# Patient Record
Sex: Male | Born: 1955
Health system: Southern US, Community
[De-identification: ages and names within clinical notes are randomized; demographics above are authoritative.]

## PROBLEM LIST (undated history)

## (undated) DIAGNOSIS — M1990 Unspecified osteoarthritis, unspecified site: Secondary | ICD-10-CM

## (undated) DIAGNOSIS — E785 Hyperlipidemia, unspecified: Secondary | ICD-10-CM

## (undated) DIAGNOSIS — K219 Gastro-esophageal reflux disease without esophagitis: Secondary | ICD-10-CM

## (undated) DIAGNOSIS — I10 Essential (primary) hypertension: Secondary | ICD-10-CM

## (undated) DIAGNOSIS — Z9109 Other allergy status, other than to drugs and biological substances: Secondary | ICD-10-CM

## (undated) DIAGNOSIS — T7840XA Allergy, unspecified, initial encounter: Secondary | ICD-10-CM

## (undated) DIAGNOSIS — K589 Irritable bowel syndrome without diarrhea: Secondary | ICD-10-CM

## (undated) HISTORY — DX: Hyperlipidemia, unspecified: E78.5

## (undated) HISTORY — DX: Allergy, unspecified, initial encounter: T78.40XA

## (undated) HISTORY — PX: UPPER GASTROINTESTINAL ENDOSCOPY: SHX188

## (undated) HISTORY — DX: Essential (primary) hypertension: I10

## (undated) HISTORY — DX: Unspecified osteoarthritis, unspecified site: M19.90

## (undated) HISTORY — PX: COLONOSCOPY: SHX174

---

## 1999-12-29 HISTORY — PX: COLECTOMY: SHX59

## 2003-05-30 ENCOUNTER — Ambulatory Visit (HOSPITAL_COMMUNITY): Admission: RE | Admit: 2003-05-30 | Discharge: 2003-05-30 | Payer: Self-pay | Admitting: Internal Medicine

## 2003-05-30 ENCOUNTER — Encounter: Payer: Self-pay | Admitting: Internal Medicine

## 2003-06-06 ENCOUNTER — Ambulatory Visit (HOSPITAL_COMMUNITY): Admission: RE | Admit: 2003-06-06 | Discharge: 2003-06-06 | Payer: Self-pay | Admitting: General Surgery

## 2006-07-27 ENCOUNTER — Ambulatory Visit (HOSPITAL_COMMUNITY): Admission: RE | Admit: 2006-07-27 | Discharge: 2006-07-27 | Payer: Self-pay | Admitting: Family Medicine

## 2006-07-27 ENCOUNTER — Encounter: Payer: Self-pay | Admitting: Vascular Surgery

## 2007-03-20 ENCOUNTER — Emergency Department (HOSPITAL_COMMUNITY): Admission: EM | Admit: 2007-03-20 | Discharge: 2007-03-20 | Payer: Self-pay | Admitting: Emergency Medicine

## 2007-09-07 ENCOUNTER — Ambulatory Visit: Payer: Self-pay | Admitting: Gastroenterology

## 2007-09-21 ENCOUNTER — Ambulatory Visit: Payer: Self-pay | Admitting: Gastroenterology

## 2007-09-21 ENCOUNTER — Encounter: Payer: Self-pay | Admitting: Gastroenterology

## 2009-01-13 ENCOUNTER — Emergency Department (HOSPITAL_COMMUNITY): Admission: EM | Admit: 2009-01-13 | Discharge: 2009-01-13 | Payer: Self-pay | Admitting: Emergency Medicine

## 2009-01-21 ENCOUNTER — Emergency Department (HOSPITAL_COMMUNITY): Admission: EM | Admit: 2009-01-21 | Discharge: 2009-01-21 | Payer: Self-pay | Admitting: Emergency Medicine

## 2009-09-12 ENCOUNTER — Emergency Department (HOSPITAL_COMMUNITY): Admission: EM | Admit: 2009-09-12 | Discharge: 2009-09-12 | Payer: Self-pay | Admitting: Emergency Medicine

## 2009-09-12 IMAGING — CT CT PELVIS W/O CM
2 of 4 series · 16 of 46 positions shown, 18 images · non-contrast
Comparison: None

CT ABDOMEN

CLINICAL DATA: Left flank pain.  History of stones.

CT OF THE ABDOMEN AND PELVIS WITHOUT CONTRAST (CT UROGRAM)
TECHNIQUE: Multidetector CT imaging was performed through the
abdomen and pelvis to include the urinary tract.

[Series 2: standard/full over (age)lbs 5.0 · axial · 0.75mm/px · z∈[-477,-37]mm · 13 of 98 slices shown, 15 images]
[im 5/98  soft-tissue]
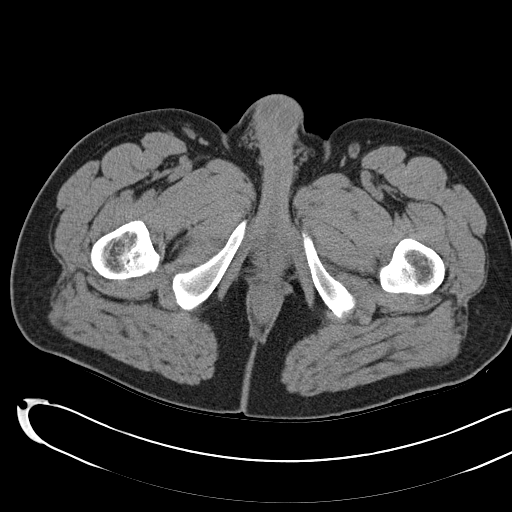
[im 5/98  bone]
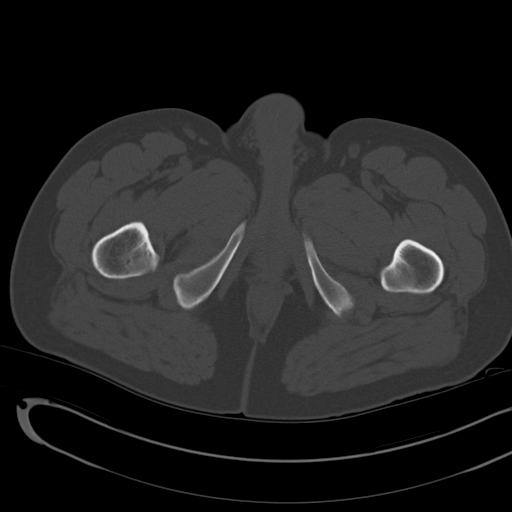
[im 13/98  soft-tissue]
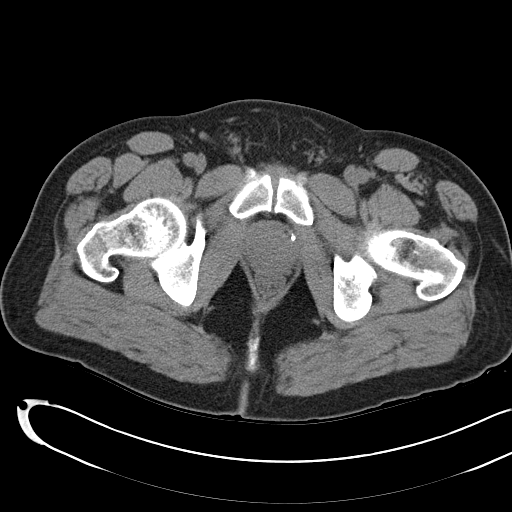
[im 21/98  soft-tissue]
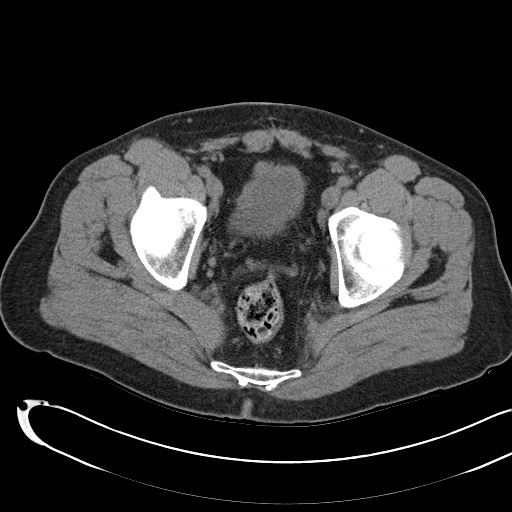
[im 29/98  soft-tissue]
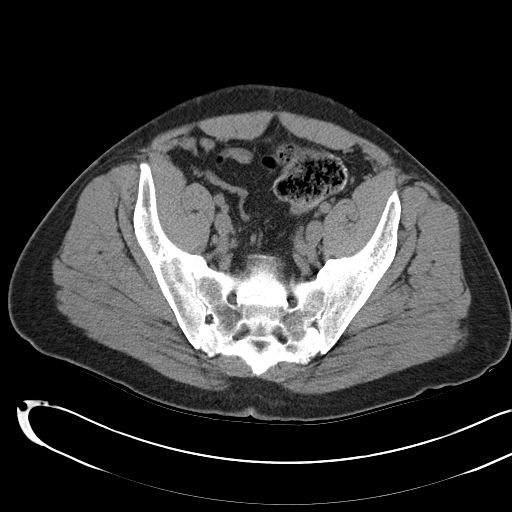
[im 33/98  soft-tissue]
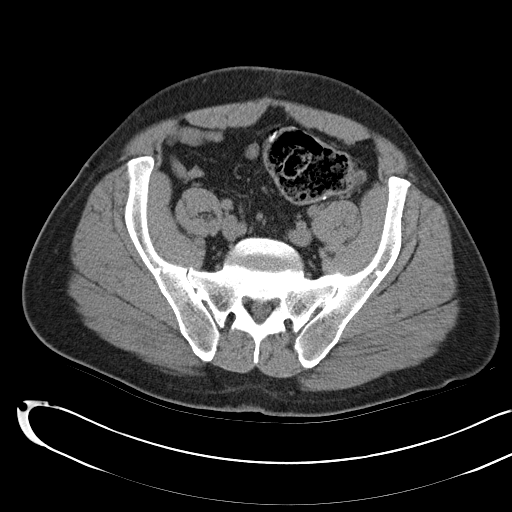
[im 41/98  soft-tissue]
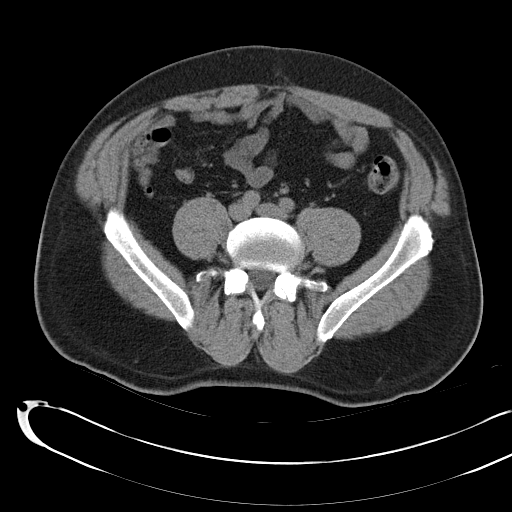
[im 49/98  soft-tissue]
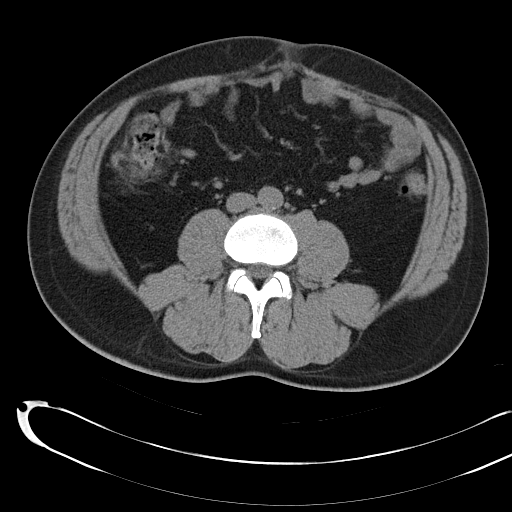
[im 57/98  soft-tissue]
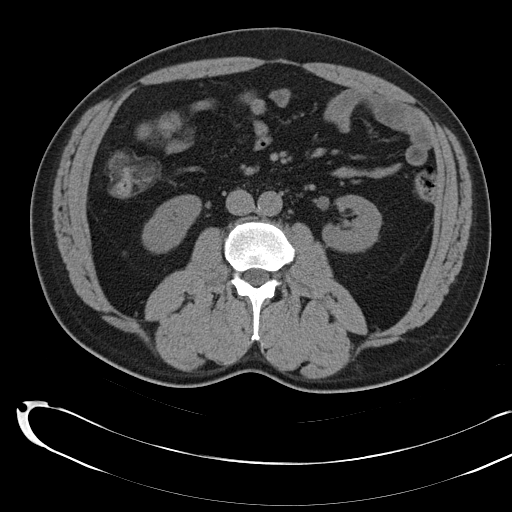
[im 65/98  soft-tissue]
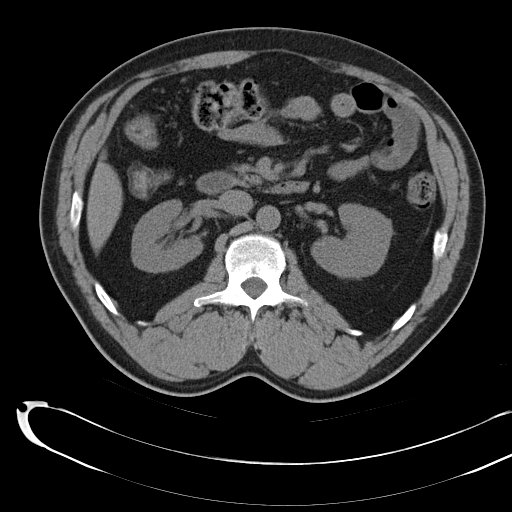
[im 65/98  bone]
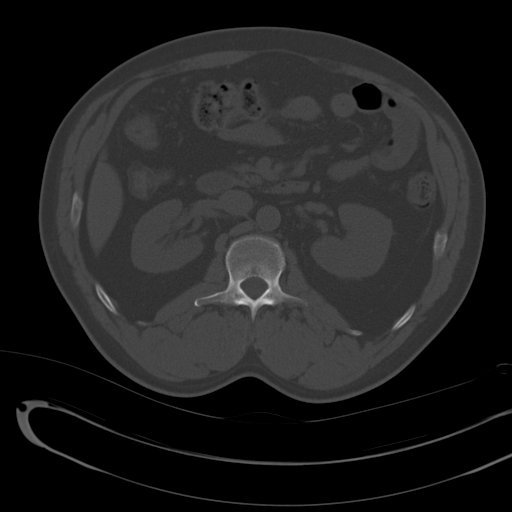
[im 69/98  soft-tissue]
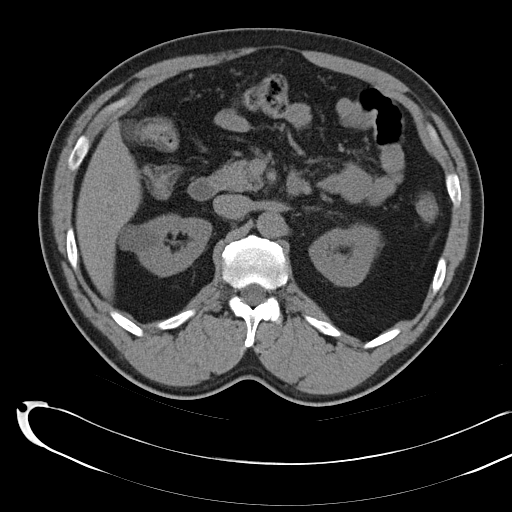
[im 77/98  soft-tissue]
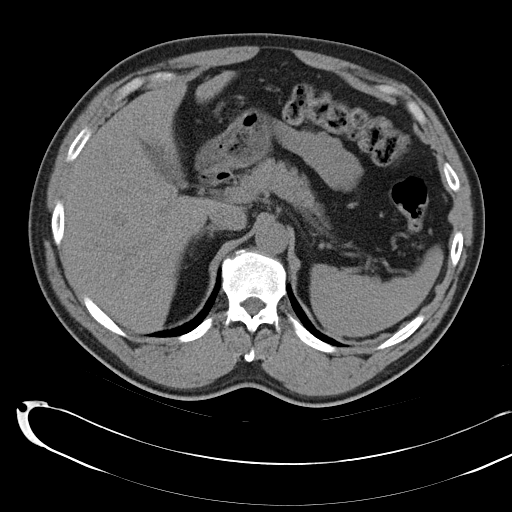
[im 85/98  soft-tissue]
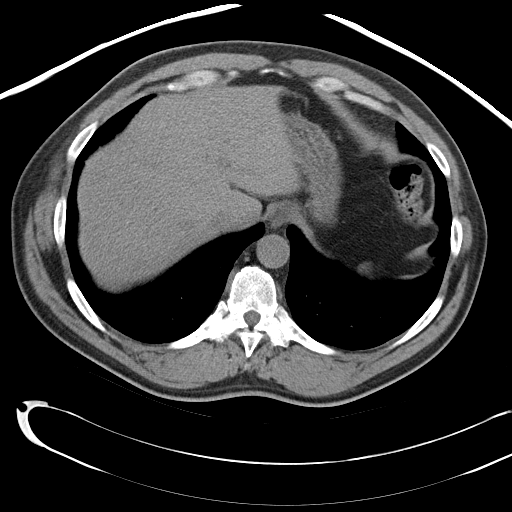
[im 93/98  soft-tissue]
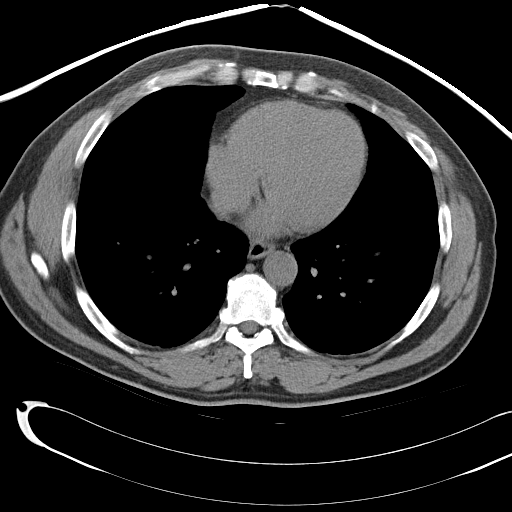

[Series 4: mpr coronal · coronal · 0.67mm/px · 3 of 82 slices shown]
[im 28/82  soft-tissue]
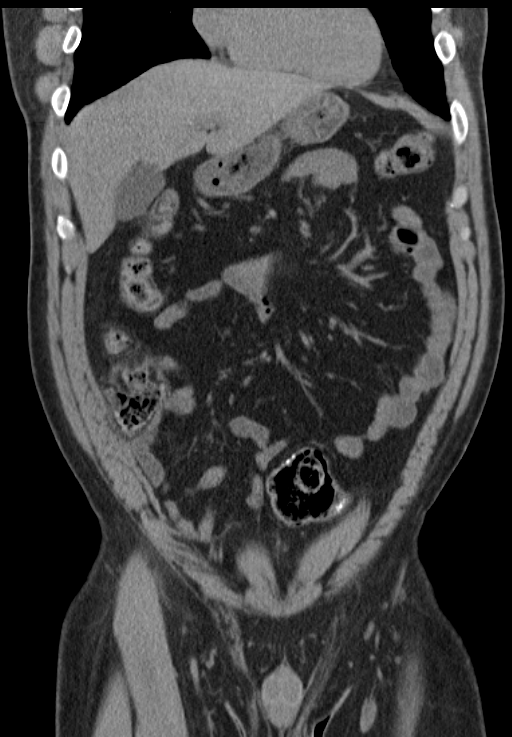
[im 37/82  soft-tissue]
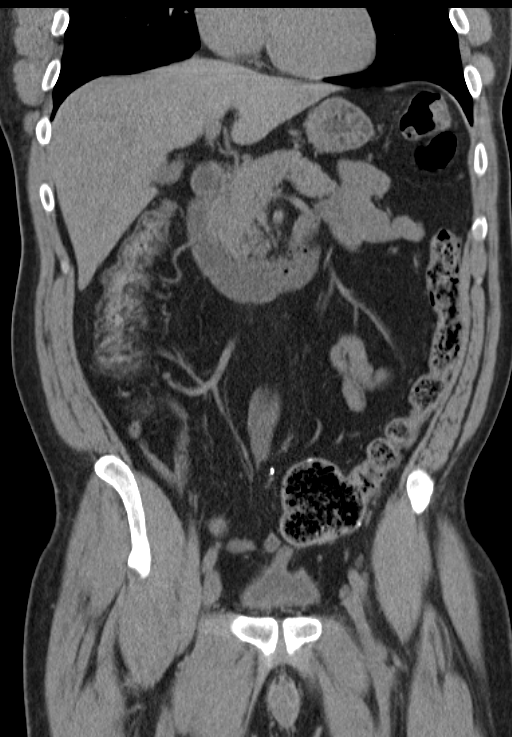
[im 46/82  soft-tissue]
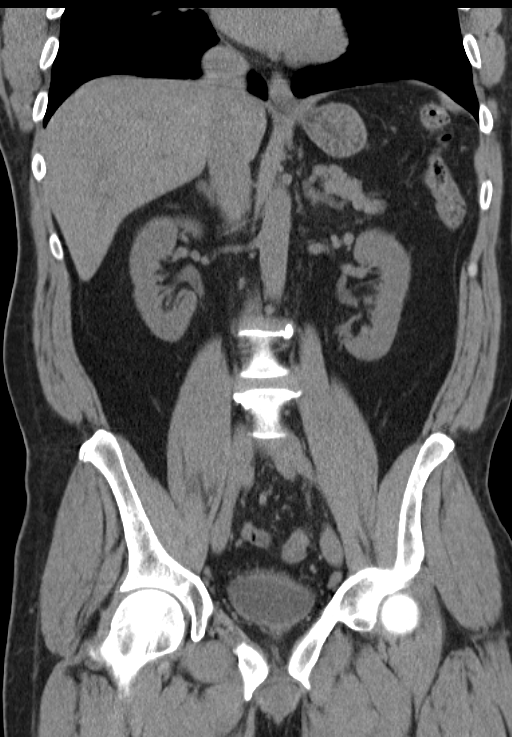

[16 of 46 positions shown; findings below may reference images not displayed]

FINDINGS: There is a 2.1 x 3.0 cm cyst arising from the lateral
aspect of the right kidney.  No acute urinary tract obstruction.
No renal calculi.  The no dilatation of the collecting systems or
ureters.  No acute intraperitoneal or retroperitoneal findings.
Liver, spleen, pancreas, gallbladder, and adrenal glands appear
unremarkable.  No free fluid.
IMPRESSION: Right renal cyst.  No acute findings.

CT PELVIS
FINDINGS: Prostate gland enlargement.  Unremarkable seminal
vesicles.  Bladder only partially filled.  No lower urinary tract
calculi.
IMPRESSION: No acute pelvic findings.

## 2011-04-03 LAB — URINALYSIS, ROUTINE W REFLEX MICROSCOPIC
Bilirubin Urine: NEGATIVE
Glucose, UA: NEGATIVE mg/dL
Ketones, ur: NEGATIVE mg/dL
Protein, ur: NEGATIVE mg/dL
Specific Gravity, Urine: 1.015 (ref 1.005–1.030)
Urobilinogen, UA: 0.2 mg/dL (ref 0.0–1.0)
pH: 5.5 (ref 5.0–8.0)

## 2011-04-03 LAB — URINE MICROSCOPIC-ADD ON

## 2011-05-15 NOTE — H&P (Signed)
   Justin Santos, Justin Santos                           ACCOUNT NO.:  [id number]   MEDICAL RECORD NO.:  [id number]                   PATIENT TYPE:  AMB   LOCATION:  DAY                                  FACILITY:  [facility]   PHYSICIAN:  Jerolyn Shin C. Katrinka Blazing, M.D.                DATE OF BIRTH:  [birth year]   DATE OF ADMISSION:  DATE OF DISCHARGE:                                HISTORY & PHYSICAL   HISTORY OF PRESENT ILLNESS:  [age]-year-old male with history of  recurrent abdominal pain and constant bloating with early satiety, and  nausea.  He has been treated by Dr. Sherrie Mustache with minimal improvement.  He is  scheduled for upper endoscopy.  He had a CT of the abdomen done, which was  normal.   PAST HISTORY:  The patient has gastroesophageal reflux disease.   MEDICATIONS:  Aciphex 20 mg daily.   PAST SURGICAL HISTORY:  Left colectomy for sigmoid volvulus in 2001.   SOCIAL HISTORY:  The patient does not drink, smoke or use drugs. He is  employed and is married.   FAMILY HISTORY:  Significant history is that his grandmother required a  colostomy for a lifetime and his son was born with malrotation of the bowel  and required colostomy, which was subsequently reversed.   REVIEW OF SYSTEMS:  Review of systems is positive for microhematuria, which  is being evaluated.   ALLERGIES:  The patient has no known drug allergies.   PHYSICAL EXAMINATION:  GENERAL:  On examination he is healthy-appearing male  in no acute distress.  VITAL SIGNS:  Blood pressure 120/80, pulse 68 and respirations 20.  Weight  194 pounds.  HEENT:  Unremarkable.  NECK:  Supple without JVD or bruit.  CHEST:  Clear to auscultation.  HEART:  Regular rate and rhythm without murmurs, gallops or rubs.  ABDOMEN:  Mild tenderness in the left upper quadrant, but no masses.  Normal  bowel sounds.  EXTREMITIES:  No cyanosis, clubbing or edema.  NEUROLOGIC EXAMINATION: No focal motor, sensory or cerebellar deficit.   IMPRESSION:  1. Dyspepsia with early satiety.  2.     Gastroesophageal reflux disease.  3. Possible irritable bowel syndrome.   PLAN:  Upper endoscopy.                                                 Dirk Dress. Katrinka Blazing, M.D.    LCS/MEDQ  D:  06/05/2003  T:  06/06/2003  Job:  [id number]

## 2012-04-18 ENCOUNTER — Other Ambulatory Visit: Payer: Self-pay | Admitting: Orthopedic Surgery

## 2012-04-26 ENCOUNTER — Encounter (HOSPITAL_BASED_OUTPATIENT_CLINIC_OR_DEPARTMENT_OTHER): Payer: Self-pay | Admitting: *Deleted

## 2012-04-26 NOTE — Progress Notes (Signed)
No  meds-no regular md-wife transporter for cone surgery

## 2012-04-28 ENCOUNTER — Encounter (HOSPITAL_BASED_OUTPATIENT_CLINIC_OR_DEPARTMENT_OTHER): Payer: Self-pay | Admitting: *Deleted

## 2012-04-28 ENCOUNTER — Ambulatory Visit (HOSPITAL_BASED_OUTPATIENT_CLINIC_OR_DEPARTMENT_OTHER): Payer: 59 | Admitting: *Deleted

## 2012-04-28 ENCOUNTER — Encounter (HOSPITAL_BASED_OUTPATIENT_CLINIC_OR_DEPARTMENT_OTHER)
Admission: RE | Disposition: A | Discharge status: Home / Self Care | Payer: Self-pay | Source: Ambulatory Visit | Attending: Orthopedic Surgery

## 2012-04-28 ENCOUNTER — Ambulatory Visit (HOSPITAL_BASED_OUTPATIENT_CLINIC_OR_DEPARTMENT_OTHER)
Admission: RE | Admit: 2012-04-28 | Discharge: 2012-04-28 | Disposition: A | Discharge status: Home / Self Care | Payer: 59 | Source: Ambulatory Visit | Attending: Orthopedic Surgery | Admitting: Orthopedic Surgery

## 2012-04-28 DIAGNOSIS — M67919 Unspecified disorder of synovium and tendon, unspecified shoulder: Secondary | ICD-10-CM | POA: Insufficient documentation

## 2012-04-28 DIAGNOSIS — M719 Bursopathy, unspecified: Secondary | ICD-10-CM | POA: Insufficient documentation

## 2012-04-28 DIAGNOSIS — K219 Gastro-esophageal reflux disease without esophagitis: Secondary | ICD-10-CM | POA: Insufficient documentation

## 2012-04-28 DIAGNOSIS — M7541 Impingement syndrome of right shoulder: Secondary | ICD-10-CM

## 2012-04-28 HISTORY — DX: Gastro-esophageal reflux disease without esophagitis: K21.9

## 2012-04-28 HISTORY — DX: Irritable bowel syndrome, unspecified: K58.9

## 2012-04-28 HISTORY — DX: Other allergy status, other than to drugs and biological substances: Z91.09

## 2012-04-28 LAB — POCT HEMOGLOBIN-HEMACUE: Hemoglobin: 13.1 g/dL (ref 13.0–17.0)

## 2012-04-28 SURGERY — SHOULDER ARTHROSCOPY WITH SUBACROMIAL DECOMPRESSION
Anesthesia: General | Site: Shoulder | Laterality: Right | Wound class: Clean

## 2012-04-28 MED ORDER — ONDANSETRON HCL 4 MG/2ML IJ SOLN: 4.0000 mg | Freq: Four times a day (QID) | INTRAMUSCULAR | Status: DC | PRN

## 2012-04-28 MED ORDER — OXYCODONE-ACETAMINOPHEN 5-325 MG PO TABS: 1.0000 | ORAL_TABLET | ORAL | Status: AC | PRN

## 2012-04-28 MED ORDER — LIDOCAINE HCL (CARDIAC) 20 MG/ML IV SOLN
INTRAVENOUS | Status: DC | PRN
  Administered 2012-04-28: 75 mg via INTRAVENOUS

## 2012-04-28 MED ORDER — BUPIVACAINE-EPINEPHRINE PF 0.5-1:200000 % IJ SOLN
INTRAMUSCULAR | Status: DC | PRN
  Administered 2012-04-28: 30 mL

## 2012-04-28 MED ORDER — SODIUM CHLORIDE 0.9 % IR SOLN
Status: DC | PRN
  Administered 2012-04-28: 6000 mL

## 2012-04-28 MED ORDER — FENTANYL CITRATE 0.05 MG/ML IJ SOLN
50.0000 ug | INTRAMUSCULAR | Status: DC | PRN
  Administered 2012-04-28: 100 ug via INTRAVENOUS

## 2012-04-28 MED ORDER — FENTANYL CITRATE 0.05 MG/ML IJ SOLN
INTRAMUSCULAR | Status: DC | PRN
  Administered 2012-04-28: 50 ug via INTRAVENOUS

## 2012-04-28 MED ORDER — EPHEDRINE SULFATE 50 MG/ML IJ SOLN
INTRAMUSCULAR | Status: DC | PRN
  Administered 2012-04-28 (×3): 5 mg via INTRAVENOUS

## 2012-04-28 MED ORDER — SUCCINYLCHOLINE CHLORIDE 20 MG/ML IJ SOLN
INTRAMUSCULAR | Status: DC | PRN
  Administered 2012-04-28: 120 mg via INTRAVENOUS

## 2012-04-28 MED ORDER — DEXAMETHASONE SODIUM PHOSPHATE 4 MG/ML IJ SOLN
INTRAMUSCULAR | Status: DC | PRN
  Administered 2012-04-28: 10 mg via INTRAVENOUS

## 2012-04-28 MED ORDER — MIDAZOLAM HCL 2 MG/2ML IJ SOLN
1.0000 mg | INTRAMUSCULAR | Status: DC | PRN
  Administered 2012-04-28: 2 mg via INTRAVENOUS

## 2012-04-28 MED ORDER — CEFAZOLIN SODIUM 1-5 GM-% IV SOLN
1.0000 g | Freq: Once | INTRAVENOUS | Status: AC
  Administered 2012-04-28: 2 g via INTRAVENOUS

## 2012-04-28 MED ORDER — HYDROMORPHONE HCL PF 1 MG/ML IJ SOLN: 0.2500 mg | INTRAMUSCULAR | Status: DC | PRN

## 2012-04-28 MED ORDER — LACTATED RINGERS IV SOLN
INTRAVENOUS | Status: DC
  Administered 2012-04-28 (×2): via INTRAVENOUS

## 2012-04-28 MED ORDER — LIDOCAINE HCL 4 % MT SOLN
OROMUCOSAL | Status: DC | PRN
  Administered 2012-04-28: 2 mL via TOPICAL

## 2012-04-28 MED ORDER — PROPOFOL 10 MG/ML IV EMUL
INTRAVENOUS | Status: DC | PRN
  Administered 2012-04-28: 30 mg via INTRAVENOUS
  Administered 2012-04-28: 20 mg via INTRAVENOUS
  Administered 2012-04-28: 200 mg via INTRAVENOUS

## 2012-04-28 MED ORDER — ONDANSETRON HCL 4 MG/2ML IJ SOLN
INTRAMUSCULAR | Status: DC | PRN
  Administered 2012-04-28: 4 mg via INTRAVENOUS

## 2012-04-28 SURGICAL SUPPLY — 89 items
BENZOIN TINCTURE PRP APPL 2/3 (GAUZE/BANDAGES/DRESSINGS) IMPLANT
BLADE 4.2CUDA (BLADE) IMPLANT
BLADE CUDA 5.5 (BLADE) IMPLANT
BLADE CUTTER GATOR 3.5 (BLADE) IMPLANT
BLADE GREAT WHITE 4.2 (BLADE) IMPLANT
BLADE SURG 15 STRL LF DISP TIS (BLADE) IMPLANT
BLADE SURG 15 STRL SS (BLADE)
BLADE SURG ROTATE 9660 (MISCELLANEOUS) IMPLANT
BUR 3.5 LG SPHERICAL (BURR) IMPLANT
BUR OVAL 4.0 (BURR) ×3 IMPLANT
BUR OVAL 6.0 (BURR) IMPLANT
BURR 3.5 LG SPHERICAL (BURR)
CANISTER OMNI JUG 16 LITER (MISCELLANEOUS) IMPLANT
CANISTER SUCTION 2500CC (MISCELLANEOUS) IMPLANT
CANNULA 5.75X71 LONG (CANNULA) ×3 IMPLANT
CANNULA TWIST IN 8.25X7CM (CANNULA) IMPLANT
CHLORAPREP W/TINT 26ML (MISCELLANEOUS) ×3 IMPLANT
CLOTH BEACON ORANGE TIMEOUT ST (SAFETY) ×3 IMPLANT
DECANTER SPIKE VIAL GLASS SM (MISCELLANEOUS) IMPLANT
DERMABOND ADVANCED (GAUZE/BANDAGES/DRESSINGS)
DERMABOND ADVANCED .7 DNX12 (GAUZE/BANDAGES/DRESSINGS) IMPLANT
DRAPE INCISE IOBAN 66X45 STRL (DRAPES) ×3 IMPLANT
DRAPE STERI 35X30 U-POUCH (DRAPES) ×3 IMPLANT
DRAPE SURG 17X23 STRL (DRAPES) ×3 IMPLANT
DRAPE U 20/CS (DRAPES) ×3 IMPLANT
DRAPE U-SHAPE 47X51 STRL (DRAPES) ×3 IMPLANT
DRAPE U-SHAPE 76X120 STRL (DRAPES) ×6 IMPLANT
DRSG PAD ABDOMINAL 8X10 ST (GAUZE/BANDAGES/DRESSINGS) ×3 IMPLANT
ELECT REM PT RETURN 9FT ADLT (ELECTROSURGICAL)
ELECTRODE REM PT RTRN 9FT ADLT (ELECTROSURGICAL) IMPLANT
GAUZE SPONGE 4X4 16PLY XRAY LF (GAUZE/BANDAGES/DRESSINGS) IMPLANT
GAUZE XEROFORM 1X8 LF (GAUZE/BANDAGES/DRESSINGS) ×3 IMPLANT
GLOVE BIO SURGEON STRL SZ 6.5 (GLOVE) ×3 IMPLANT
GLOVE BIO SURGEON STRL SZ7 (GLOVE) ×6 IMPLANT
GLOVE BIO SURGEON STRL SZ7.5 (GLOVE) ×3 IMPLANT
GLOVE BIOGEL PI IND STRL 7.0 (GLOVE) ×4 IMPLANT
GLOVE BIOGEL PI IND STRL 7.5 (GLOVE) ×2 IMPLANT
GLOVE BIOGEL PI IND STRL 8 (GLOVE) ×2 IMPLANT
GLOVE BIOGEL PI INDICATOR 7.0 (GLOVE) ×2
GLOVE BIOGEL PI INDICATOR 7.5 (GLOVE) ×1
GLOVE BIOGEL PI INDICATOR 8 (GLOVE) ×1
GOWN PREVENTION PLUS XLARGE (GOWN DISPOSABLE) ×6 IMPLANT
GOWN PREVENTION PLUS XXLARGE (GOWN DISPOSABLE) ×6 IMPLANT
NDL SUT 6 .5 CRC .975X.05 MAYO (NEEDLE) IMPLANT
NEEDLE 1/2 CIR CATGUT .05X1.09 (NEEDLE) IMPLANT
NEEDLE MAYO TAPER (NEEDLE)
NEEDLE SCORPION MULTI FIRE (NEEDLE) IMPLANT
NS IRRIG 1000ML POUR BTL (IV SOLUTION) IMPLANT
PACK ARTHROSCOPY DSU (CUSTOM PROCEDURE TRAY) ×3 IMPLANT
PACK BASIN DAY SURGERY FS (CUSTOM PROCEDURE TRAY) ×3 IMPLANT
PENCIL BUTTON HOLSTER BLD 10FT (ELECTRODE) IMPLANT
RESECTOR FULL RADIUS 4.2MM (BLADE) ×3 IMPLANT
RESECTOR FULL RADIUS 4.8MM (BLADE) IMPLANT
SHEET MEDIUM DRAPE 40X70 STRL (DRAPES) IMPLANT
SLEEVE SCD COMPRESS KNEE MED (MISCELLANEOUS) ×3 IMPLANT
SLING ARM FOAM STRAP LRG (SOFTGOODS) IMPLANT
SLING ARM FOAM STRAP MED (SOFTGOODS) IMPLANT
SLING ARM FOAM STRAP XLG (SOFTGOODS) ×3 IMPLANT
SLING ARM IMMOBILIZER MED (SOFTGOODS) IMPLANT
SPONGE GAUZE 4X4 12PLY (GAUZE/BANDAGES/DRESSINGS) ×3 IMPLANT
SPONGE LAP 4X18 X RAY DECT (DISPOSABLE) IMPLANT
STRIP CLOSURE SKIN 1/2X4 (GAUZE/BANDAGES/DRESSINGS) IMPLANT
SUCTION FRAZIER TIP 10 FR DISP (SUCTIONS) IMPLANT
SUPPORT WRAP ARM LG (MISCELLANEOUS) IMPLANT
SUT 2 FIBERLOOP 20 STRT BLUE (SUTURE)
SUT BONE WAX W31G (SUTURE) IMPLANT
SUT ETHIBOND 2 OS 4 DA (SUTURE) IMPLANT
SUT ETHILON 3 0 PS 1 (SUTURE) ×3 IMPLANT
SUT ETHILON 4 0 PS 2 18 (SUTURE) IMPLANT
SUT FIBERWIRE #2 38 T-5 BLUE (SUTURE)
SUT FIBERWIRE 2-0 18 17.9 3/8 (SUTURE)
SUT MNCRL AB 3-0 PS2 18 (SUTURE) IMPLANT
SUT MNCRL AB 4-0 PS2 18 (SUTURE) IMPLANT
SUT PDS AB 0 CT 36 (SUTURE) IMPLANT
SUT PROLENE 3 0 PS 2 (SUTURE) IMPLANT
SUT VIC AB 0 CT1 18XCR BRD 8 (SUTURE) IMPLANT
SUT VIC AB 0 CT1 8-18 (SUTURE)
SUT VIC AB 2-0 SH 18 (SUTURE) IMPLANT
SUTURE 2 FIBERLOOP 20 STRT BLU (SUTURE) IMPLANT
SUTURE FIBERWR #2 38 T-5 BLUE (SUTURE) IMPLANT
SUTURE FIBERWR 2-0 18 17.9 3/8 (SUTURE) IMPLANT
SYR BULB 3OZ (MISCELLANEOUS) IMPLANT
TOWEL OR 17X24 6PK STRL BLUE (TOWEL DISPOSABLE) ×3 IMPLANT
TOWEL OR NON WOVEN STRL DISP B (DISPOSABLE) ×3 IMPLANT
TUBE CONNECTING 20X1/4 (TUBING) ×3 IMPLANT
TUBING ARTHROSCOPY IRRIG 16FT (MISCELLANEOUS) ×3 IMPLANT
WAND STAR VAC 90 (SURGICAL WAND) ×3 IMPLANT
WATER STERILE IRR 1000ML POUR (IV SOLUTION) ×3 IMPLANT
YANKAUER SUCT BULB TIP NO VENT (SUCTIONS) IMPLANT

## 2012-04-28 NOTE — Progress Notes (Signed)
Assisted Dr. Hodierne with right, ultrasound guided, interscalene  block. Side rails up, monitors on throughout procedure. See vital signs in flow sheet. Tolerated Procedure well. 

## 2012-04-28 NOTE — Discharge Instructions (Addendum)
Discharge Instructions after Arthroscopic Shoulder Surgery ° ° °A sling has been provided for you. You may remove the sling after 72 hours. The sling may be worn for your protection, if you are in a crowd.  °Use ice on the shoulder intermittently over the first 48 hours after surgery.  °Pain medication has been prescribed for you.  °Use your medication liberally over the first 48 hours, and then begin to taper your use. You may take Extra Strength Tylenol or Tylenol only in place of the pain pills. DO NOT take ANY nonsteroidal anti-inflammatory pain medications: Advil, Motrin, Ibuprofen, Aleve, Naproxen, or Naprosyn.  °You may remove your dressing after two days.  °You may shower 5 days after surgery. The incision CANNOT get wet prior to 5 days. Simply allow the water to wash over the site and then pat dry. Do not rub the incision. Make sure your axilla (armpit) is completely dry after showering.  °Take one aspirin a day for 2 weeks after surgery, unless you have an aspirin sensitivity/allergy or asthma.  °Three to 5 times each day you should perform assisted overhead reaching and external rotation (outward turning) exercises with the operative arm. Both exercises should be done with the non-operative arm used as the "therapist arm" while the operative arm remains relaxed. Ten of each exercise should be done three to five times each day. ° ° ° °Overhead reach is helping to lift your stiff arm up as high as it will go. To stretch your overhead reach, lie flat on your back, relax, and grasp the wrist of the tight shoulder with your opposite hand. Using the power in your opposite arm, bring the stiff arm up as far as it is comfortable. Start holding it for ten seconds and then work up to where you can hold it for a count of 30. Breathe slowly and deeply while the arm is moved. Repeat this stretch ten times, trying to help the arm up a little higher each time.  ° ° ° ° ° °External rotation is turning the arm out to  the side while your elbow stays close to your body. External rotation is best stretched while you are lying on your back. Hold a cane, yardstick, broom handle, or dowel in both hands. Bend both elbows to a right angle. Use steady, gentle force from your normal arm to rotate the hand of the stiff shoulder out away from your body. Continue the rotation as far as it will go comfortably, holding it there for a count of 10. Repeat this exercise ten times.  ° ° ° °Please call [phone] during normal business hours or [phone] after hours for any problems. Including the following: ° °- excessive redness of the incisions °- drainage for more than 4 days °- fever of more than 101.5 F ° °*Please note that pain medications will not be refilled after hours or on weekends. ° ° ° °Post Anesthesia Home Care Instructions ° °Activity: °Get plenty of rest for the remainder of the day. A responsible adult should stay with you for 24 hours following the procedure.  °For the next 24 hours, DO NOT: °-Drive a car °-Operate machinery °-Drink alcoholic beverages °-Take any medication unless instructed by your physician °-Make any legal decisions or sign important papers. ° °Meals: °Start with liquid foods such as gelatin or soup. Progress to regular foods as tolerated. Avoid greasy, spicy, heavy foods. If nausea and/or vomiting occur, drink only clear liquids until the nausea and/or vomiting subsides. Call   your physician if vomiting continues. ° °Special Instructions/Symptoms: °Your throat may feel dry or sore from the anesthesia or the breathing tube placed in your throat during surgery. If this causes discomfort, gargle with warm salt water. The discomfort should disappear within 24 hours. ° ° °Regional Anesthesia Blocks ° °1. Numbness or the inability to move the "blocked" extremity may last from 3-48 hours after placement. The length of time depends on the medication injected and your individual response to the medication. If the  numbness is not going away after 48 hours, call your surgeon. ° °2. The extremity that is blocked will need to be protected until the numbness is gone and the  Strength has returned. Because you cannot feel it, you will need to take extra care to avoid injury. Because it may be weak, you may have difficulty moving it or using it. You may not know what position it is in without looking at it while the block is in effect. ° °3. For blocks in the legs and feet, returning to weight bearing and walking needs to be done carefully. You will need to wait until the numbness is entirely gone and the strength has returned. You should be able to move your leg and foot normally before you try and bear weight or walk. You will need someone to be with you when you first try to ensure you do not fall and possibly risk injury. ° °4. Bruising and tenderness at the needle site are common side effects and will resolve in a few days. ° °5. Persistent numbness or new problems with movement should be communicated to the surgeon or the [facility] ([phone])/ [facility] ([phone]). °

## 2012-04-28 NOTE — Anesthesia Procedure Notes (Addendum)
Anesthesia Regional Block:  Interscalene brachial plexus block  Pre-Anesthetic Checklist: ,, timeout performed, Correct Patient, Correct Site, Correct Laterality, Correct Procedure, Correct Position, site marked, Risks and benefits discussed,  Surgical consent,  Pre-op evaluation,  At surgeon's request and post-op pain management  Laterality: Right  Prep: chloraprep       Needles:  Injection technique: Single-shot  Needle Type: Echogenic Stimulator Needle     Needle Length: 5cm 5 cm Needle Gauge: 22 and 22 G    Additional Needles:  Procedures: ultrasound guided and nerve stimulator Interscalene brachial plexus block  Nerve Stimulator or Paresthesia:  Response: biceps flexion, 0.45 mA,   Additional Responses:   Narrative:  Start time: 04/28/2012 10:01 AM End time: 04/28/2012 10:13 AM Injection made incrementally with aspirations every 5 mL.  Performed by: Personally  Anesthesiologist: Dr Chaney Malling  Additional Notes: Functioning IV was confirmed and monitors were applied.  A 50mm 22ga Arrow echogenic stimulator needle was used. Sterile prep and drape,hand hygiene and sterile gloves were used.  Negative aspiration and negative test dose prior to incremental administration of local anesthetic. The patient tolerated the procedure well.  Ultrasound guidance: relevent anatomy identified, needle position confirmed, local anesthetic spread visualized around nerve(s), vascular puncture avoided.  Image printed for medical record.   Interscalene brachial plexus block Procedure Name: Intubation Date/Time: 04/28/2012 10:32 AM Performed by: Meyer Russel Pre-anesthesia Checklist: Patient identified, Emergency Drugs available, Suction available and Patient being monitored Patient Re-evaluated:Patient Re-evaluated prior to inductionOxygen Delivery Method: Circle System Utilized Preoxygenation: Pre-oxygenation with 100% oxygen Intubation Type: IV induction Ventilation: Mask ventilation  without difficulty Laryngoscope Size: 2 and Miller Grade View: Grade II Tube type: Oral Tube size: 8.0 mm Number of attempts: 1 Airway Equipment and Method: stylet and LTA kit utilized Placement Confirmation: ETT inserted through vocal cords under direct vision,  positive ETCO2 and breath sounds checked- equal and bilateral Secured at: 24 cm Tube secured with: Tape Dental Injury: Teeth and Oropharynx as per pre-operative assessment

## 2012-04-28 NOTE — H&P (Signed)
Justin Santos is an 56 y.o. male.   Chief Complaint: R shoulder pain  HPI: R shoulder pain with rotator cuff tear by MRI.   Past Medical History  Diagnosis Date  . Environmental allergies   . IBS (irritable bowel syndrome)   . GERD (gastroesophageal reflux disease)     Past Surgical History  Procedure Date  . Upper gastrointestinal endoscopy   . Colectomy 2001    History reviewed. No pertinent family history. Social History:  reports that he quit smoking about 18 years ago. He does not have any smokeless tobacco history on file. He reports that he does not drink alcohol or use illicit drugs.  Allergies:  Allergies  Allergen Reactions  . Peanuts (Peanut Oil) Itching    Rash and mouth/throat itching    Medications Prior to Admission  Medication Sig Dispense Refill  . fluticasone (FLONASE) 50 MCG/ACT nasal spray Place 2 sprays into the nose daily.      . naproxen sodium (ANAPROX) 220 MG tablet Take 220 mg by mouth 2 (two) times daily with a meal.        No results found for this or any previous visit (from the past 48 hour(s)). No results found.  Review of Systems  All other systems reviewed and are negative.    Blood pressure 124/76, pulse 69, temperature 98.3 F (36.8 C), temperature source Oral, resp. rate 12, height 6' (1.829 m), weight 94.348 kg (208 lb), SpO2 100.00%. Physical Exam  Constitutional: He is oriented to person, place, and time. He appears well-developed and well-nourished.  HENT:  Head: Atraumatic.  Eyes: EOM are normal.  Cardiovascular: Intact distal pulses.   Respiratory: Effort normal.  Musculoskeletal:       Right shoulder: He exhibits tenderness. He exhibits no swelling, no effusion and no deformity.  Neurological: He is alert and oriented to person, place, and time.  Skin: Skin is warm and dry.  Psychiatric: He has a normal mood and affect.     Assessment/Plan R shoulder rotator cuff tear Risks / benefits of surgery discussed,  debridement/SAD versus repair. Consent on chart  NPO for OR Preop antibiotics   Enedelia Martorelli WILLIAM 04/28/2012, 10:16 AM

## 2012-04-28 NOTE — Transfer of Care (Signed)
Immediate Anesthesia Transfer of Care Note  Patient: Justin Santos  Procedure(s) Performed: Procedure(s) (LRB): SHOULDER ARTHROSCOPY WITH SUBACROMIAL DECOMPRESSION ()  Patient Location: PACU  Anesthesia Type: GA combined with regional for post-op pain  Level of Consciousness: awake and patient cooperative  Airway & Oxygen Therapy: Patient Spontanous Breathing and Patient connected to face mask oxygen  Post-op Assessment: Report given to PACU RN, Post -op Vital signs reviewed and stable and Patient moving all extremities  Post vital signs: Reviewed and stable  Complications: No apparent anesthesia complications

## 2012-04-28 NOTE — Anesthesia Postprocedure Evaluation (Signed)
Anesthesia Post Note  Patient: Justin Santos  Procedure(s) Performed: Procedure(s) (LRB): SHOULDER ARTHROSCOPY WITH SUBACROMIAL DECOMPRESSION ()  Anesthesia type: General  Patient location: PACU  Post pain: Pain level controlled and Adequate analgesia  Post assessment: Post-op Vital signs reviewed, Patient's Cardiovascular Status Stable, Respiratory Function Stable, Patent Airway and Pain level controlled  Last Vitals:  Filed Vitals:   04/28/12 1200  BP: 145/86  Pulse: 93  Temp:   Resp: 20    Post vital signs: Reviewed and stable  Level of consciousness: awake, alert  and oriented  Complications: No apparent anesthesia complications

## 2012-04-28 NOTE — Op Note (Signed)
Procedure(s): SHOULDER ARTHROSCOPY WITH SUBACROMIAL DECOMPRESSION Procedure Note  Justin Santos male 56 y.o. 04/28/2012  Procedure(s) and Anesthesia Type:    * SHOULDER ARTHROSCOPY WITH SUBACROMIAL DECOMPRESSION and debridement of partial-thickness subscapularis tear- General  Surgeon(s) and Role:    * Mable Paris, MD - Primary     Surgeon: Mable Paris   Assistants: Damita Lack PA-S.  Anesthesia: General endotracheal anesthesia and interscalene block regional    Procedure Detail  SHOULDER ARTHROSCOPY WITH SUBACROMIAL DECOMPRESSION  Estimated Blood Loss: Min         Drains: none  Blood Given: none         Specimens: none        Complications:  * No complications entered in OR log *         Disposition: PACU - hemodynamically stable.         Condition: stable    Procedure:   INDICATIONS FOR SURGERY: The patient is 56 y.o. male who has a history of right shoulder pain with an MRI which revealed high-grade to small full thickness tear of the anterior supraspinatus. He also type III anterior acromion with unfavorable acromial anatomy. He responded to subacromial injection at return of symptoms. He was to go forward with surgical treatment.  OPERATIVE FINDINGS: Examination under anesthesia: No stiffness or instability Diagnostic Arthroscopy:  Glenoid articular cartilage: Intact Humeral head articular cartilage: Intact Labrum: Intact Loose bodies: None Synovitis: Mild intra-articular Articular sided rotator cuff: Intact. The area of concern on MRI was carefully examined just posterior to the biceps. There is no partial or full-thickness tear in this region. He had a small area of exposed tuberosity posteriorly near infraspinatus insertion. Bursal sided rotator cuff: Intact, extensive bursitis. Coracoacromial ligament: Extensively frayed. He a large hooked anterior acromial spur.  DESCRIPTION OF PROCEDURE: The patient was identified in  preoperative  holding area where I personally marked the operative site after  verifying site, side, and procedure with the patient. An interscalene block was given by the attending anesthesiologist the holding area.  The patient was taken back to the operating room where general anesthesia was induced without complication and was placed in the beach-chair position with the back  elevated about 60 degrees and all extremities and head and neck carefully padded and  positioned.   The right upper extremity was then prepped and  draped in a standard sterile fashion. The appropriate time-out  procedure was carried out. The patient did receive IV antibiotics  within 30 minutes of incision.   A small posterior portal incision was made and the arthroscope was introduced into the joint. An anterior portal was then established above the subscapularis using needle localization. Small cannula was placed anteriorly. Diagnostic arthroscopy was then carried out with findings as described above.  The shaver was used through the anterior portal in the glenohumeral joint to debride a partial-thickness insertional tear of the subscapularis. The involved about 20% of the insertion. After debridement the remaining portion of the tendon insertion was carefully examined. The remaining tendon was healthy appearing and no formal repair was felt necessary. There was some mild fraying of the long head biceps insertion which was debrided as well. The biceps was pulled in the joint and did not have any extensive tearing or synovitis.  The arthroscope was then introduced into the subacromial space a standard lateral portal was established with needle localization. The shaver was used through the lateral portal to perform extensive bursectomy. Coracoacromial ligament was examined and found to  be frayed indicating chronic impingement..  After an extensive bursectomy the anterior and posterior bursal surface rotator cuff were  carefully examined and probed. No significant tearing was noted. The cuff was completely intact.   The coracoacromial ligament was taken down off the anterior acromion with the ArthroCare exposing a large hooked anterior acromial spur. A 4 mm high-speed bur was then used through the lateral portal to take down the anterior acromial spur from lateral to medial in a standard acromioplasty.  The acromioplasty was also viewed from the lateral portal and the bur was used as necessary to ensure that the acromion was completely flat from posterior to anterior.  The shaver was run in the joint to remove any excess bone dust.  The arthroscopic equipment was removed from the joint and the portals were closed with 3-0 nylon in an interrupted fashion. Sterile dressings were then applied including Xeroform 4 x 4's ABDs and tape. The patient was then allowed to awaken from general anesthesia, placed in a sling, transferred to the stretcher and taken to the recovery room in stable condition.   POSTOPERATIVE PLAN: The patient will be discharged home today and will followup in one week for suture removal and wound check.  He will begin some gentle exercises within the next 48 hours when comfortable.

## 2012-04-28 NOTE — Anesthesia Preprocedure Evaluation (Addendum)
Anesthesia Evaluation  Patient identified by MRN, date of birth, ID band Patient awake    Reviewed: Allergy & Precautions, H&P , NPO status , Patient's Chart, lab work & pertinent test results  Airway Mallampati: II  Neck ROM: full    Dental   Pulmonary          Cardiovascular     Neuro/Psych    GI/Hepatic GERD-  ,  Endo/Other    Renal/GU      Musculoskeletal   Abdominal   Peds  Hematology   Anesthesia Other Findings   Reproductive/Obstetrics                           Anesthesia Physical Anesthesia Plan  ASA: II  Anesthesia Plan: General and Regional   Post-op Pain Management: MAC Combined w/ Regional for Post-op pain   Induction: Intravenous  Airway Management Planned: Oral ETT  Additional Equipment:   Intra-op Plan:   Post-operative Plan: Extubation in OR  Informed Consent: I have reviewed the patients History and Physical, chart, labs and discussed the procedure including the risks, benefits and alternatives for the proposed anesthesia with the patient or authorized representative who has indicated his/her understanding and acceptance.     Plan Discussed with: CRNA and Surgeon  Anesthesia Plan Comments:         Anesthesia Quick Evaluation

## 2012-05-04 ENCOUNTER — Encounter (HOSPITAL_BASED_OUTPATIENT_CLINIC_OR_DEPARTMENT_OTHER): Payer: Self-pay

## 2012-07-27 ENCOUNTER — Encounter: Payer: Self-pay | Admitting: Gastroenterology

## 2013-04-13 ENCOUNTER — Encounter: Payer: Self-pay | Admitting: Gastroenterology

## 2013-08-01 ENCOUNTER — Ambulatory Visit (INDEPENDENT_AMBULATORY_CARE_PROVIDER_SITE_OTHER): Payer: 59 | Admitting: Family Medicine

## 2013-08-01 VITALS — BP 132/80 | HR 73 | Temp 97.7°F | Resp 16 | Ht 70.0 in | Wt 210.0 lb

## 2013-08-01 DIAGNOSIS — L299 Pruritus, unspecified: Secondary | ICD-10-CM

## 2013-08-01 DIAGNOSIS — E663 Overweight: Secondary | ICD-10-CM | POA: Insufficient documentation

## 2013-08-01 DIAGNOSIS — L259 Unspecified contact dermatitis, unspecified cause: Secondary | ICD-10-CM

## 2013-08-01 MED ORDER — PREDNISONE 20 MG PO TABS: ORAL_TABLET | ORAL | Status: DC

## 2013-08-01 NOTE — Progress Notes (Signed)
[facility] [address] [place] [address] [phone]  Date:  08/01/2013   Name:  Justin Santos   DOB:  10-16-1956   MRN:  [id number]  PCP:  No primary provider on file.    Chief Complaint: Rash   History of Present Illness:  Justin Santos is a 57 y.o. very pleasant male patient who presents with the following:  Here as a new pt today.  History of AR.   He is here today with an itchy rash on his arms and the back of his neck and his head.  The rash has been present for about 2 weeks, and has been spreading.  He is scratching even in his sleep.   Otherwise he feels fine, no fever.  No SOB He is not aware of any history of HTN.    He did have a partial colectomy due to a ?stricture/ volvulus problem.  He had this procedure in approx 1995.   There are no active problems to display for this patient.   Past Medical History  Diagnosis Date  . Environmental allergies   . IBS (irritable bowel syndrome)   . GERD (gastroesophageal reflux disease)   . Allergy     Past Surgical History  Procedure Laterality Date  . Upper gastrointestinal endoscopy    . Colectomy  2001    History  Substance Use Topics  . Smoking status: Former Smoker    Quit date: 04/26/1994  . Smokeless tobacco: Not on file  . Alcohol Use: No    History reviewed. No pertinent family history.  Allergies  Allergen Reactions  . Peanuts (Peanut Oil) Itching    Rash and mouth/throat itching    Medication list has been reviewed and updated.  Current Outpatient Prescriptions on File Prior to Visit  Medication Sig Dispense Refill  . fluticasone (FLONASE) 50 MCG/ACT nasal spray Place 2 sprays into the nose daily.      . naproxen sodium (ANAPROX) 220 MG tablet Take 220 mg by mouth 2 (two) times daily with a meal.       No current facility-administered medications on file prior to visit.    Review of Systems:  As per HPI- otherwise negative.   Physical Examination: Filed  Vitals:   08/01/13 0748  BP: 160/80  Pulse: 73  Temp: 97.7 F (36.5 C)  Resp: 16   Filed Vitals:   08/01/13 0748  Height: 5\' 10"  (1.778 m)  Weight: 210 lb (95.255 kg)   Body mass index is 30.13 kg/(m^2). Ideal Body Weight: Weight in (lb) to have BMI = 25: 173.9  GEN: WDWN, NAD, Non-toxic, A & O x 3, overweight, looks well HEENT: Atraumatic, Normocephalic. Neck supple. No masses, No LAD.  Bilateral TM wnl, oropharynx normal.  PEERL,EOMI.    No angioedema Ears and Nose: No external deformity. CV: RRR, No M/G/R. No JVD. No thrill. No extra heart sounds. PULM: CTA B, no wheezes, crackles, rhonchi. No retractions. No resp. distress. No accessory muscle use. ABD: S, NT, ND, surgical scar EXTR: No c/c/e NEURO Normal gait.  PSYCH: Normally interactive. Conversant. Not depressed or anxious appearing.  Calm demeanor.  Skin: there is an excoriated rash over his bilateral arms and neck/ face.  Appears consistent with a contact dermatitis.  No swelling.  It is excoriated so I am not able to ascertain if there are any vesicles   Assessment and Plan: Contact dermatitis - Plan: predniSONE (DELTASONE) 20 MG tablet  Itching  Signed Lamar Blinks, MD

## 2013-08-01 NOTE — Patient Instructions (Addendum)
Use the prednisone as directed.  If you are not feeling better in a few days please let us know- Sooner if worse.

## 2013-08-14 ENCOUNTER — Ambulatory Visit (INDEPENDENT_AMBULATORY_CARE_PROVIDER_SITE_OTHER): Payer: 59 | Admitting: Family Medicine

## 2013-08-14 VITALS — BP 132/80 | HR 82 | Temp 98.5°F | Resp 16 | Ht 70.0 in | Wt 207.8 lb

## 2013-08-14 DIAGNOSIS — L0291 Cutaneous abscess, unspecified: Secondary | ICD-10-CM

## 2013-08-14 DIAGNOSIS — R21 Rash and other nonspecific skin eruption: Secondary | ICD-10-CM

## 2013-08-14 DIAGNOSIS — L309 Dermatitis, unspecified: Secondary | ICD-10-CM

## 2013-08-14 DIAGNOSIS — L259 Unspecified contact dermatitis, unspecified cause: Secondary | ICD-10-CM

## 2013-08-14 MED ORDER — SULFAMETHOXAZOLE-TMP DS 800-160 MG PO TABS: 1.0000 | ORAL_TABLET | Freq: Two times a day (BID) | ORAL | Status: DC

## 2013-08-14 MED ORDER — TRIAMCINOLONE ACETONIDE 0.1 % EX CREA: TOPICAL_CREAM | Freq: Three times a day (TID) | CUTANEOUS | Status: DC

## 2013-08-14 NOTE — Patient Instructions (Signed)

## 2013-08-14 NOTE — Progress Notes (Signed)
Patient ID: HUSAIN COSTABILE MRN: [id number], DOB: [birth year], 57 y.o. Date of Encounter: 08/14/2013, 8:40 AM    PROCEDURE NOTE: Verbal consent obtained. Local anesthesia obtained with 2 cc 2% lidocaine plain.   0.5 cm incision made with 11 blade along lesion.  Culture taken. No purulence expressed. Lesion explored revealing no loculations. Not packed.  Dressed. Wound care instructions including precautions with patient. Patient tolerated the procedure well. Recheck as needed.      Grier Mitts, PA-C 08/14/2013 8:40 AM

## 2013-08-14 NOTE — Progress Notes (Signed)
[facility]:  Office Visit  Chief Complaint:  Chief Complaint  Patient presents with  . Rash    both arms and back of neck    HPI: Justin Santos is a 57 y.o. male who complains of  Here for recheck of rash, he states that he had rash about 13 days ago for contact dermatitis, he was outside painting a deck and had a rash on both his arms and his neck. He was put on steroids and it got better but once his steroid were done he developed a worsening rash. It was 107 degrees outside that day. He only has it opn areas where he was exposed to sun, ie arms and neck, was wearing short sleeve shirt, hat and also pants.  No fevers or chills. He has 2 large boils on his arms, they do not hurt, they did ooze some pus. He has itching.   Past Medical History  Diagnosis Date  . Environmental allergies   . IBS (irritable bowel syndrome)   . GERD (gastroesophageal reflux disease)   . Allergy    Past Surgical History  Procedure Laterality Date  . Upper gastrointestinal endoscopy    . Colectomy  2001   History   Social History  . Marital Status: Married    Spouse Name: N/A    Number of Children: N/A  . Years of Education: N/A   Social History Main Topics  . Smoking status: Former Smoker    Quit date: 04/26/1994  . Smokeless tobacco: None  . Alcohol Use: No  . Drug Use: No  . Sexual Activity: None   Other Topics Concern  . None   Social History Narrative  . None   No family history on file. Allergies  Allergen Reactions  . Peanuts [Peanut Oil] Itching    Rash and mouth/throat itching   Prior to Admission medications   Medication Sig Start Date End Date Taking? Authorizing Provider  fluticasone (FLONASE) 50 MCG/ACT nasal spray Place 2 sprays into the nose daily.    Historical Provider, MD  predniSONE (DELTASONE) 20 MG tablet Take 2 pills a day for 5 days, then 1 pill a day for 5 days 08/01/13   Pearline Cables, MD     ROS: The patient denies fevers, chills,  night sweats, unintentional weight loss, chest pain, palpitations, wheezing, dyspnea on exertion, nausea, vomiting, abdominal pain, dysuria, hematuria, melena, numbness, weakness, or tingling.   All other systems have been reviewed and were otherwise negative with the exception of those mentioned in the HPI and as above.    PHYSICAL EXAM: Filed Vitals:   08/14/13 0751  BP: 132/80  Pulse: 82  Temp: 98.5 F (36.9 C)  Resp: 16   Filed Vitals:   08/14/13 0751  Height: 5\' 10"  (1.778 m)  Weight: 207 lb 12.8 oz (94.257 kg)   Body mass index is 29.82 kg/(m^2).  General: Alert, no acute distress HEENT:  Normocephalic, atraumatic, oropharynx patent. EOMI, PERRLA Cardiovascular:  Regular rate and rhythm, no rubs murmurs or gallops.   No pedal edema.  Respiratory: Clear to auscultation bilaterally.  No wheezes, rales, or rhonchi.  No cyanosis, no use of accessory musculature GI: No organomegaly, abdomen is soft and non-tender, positive bowel sounds.  No masses. Skin: + eczematous, prurituc diffuse erythematous rash bilateral arms and also on neck, he has 2 boils 1 on each arm about 1/2 by 1 inch, nonfluctuant, but they have openingins to them which are closed.  Neurologic: Facial musculature symmetric. Psychiatric: Patient is appropriate throughout our interaction. Lymphatic: No cervical lymphadenopathy Musculoskeletal: Gait intact.   LABS: Results for orders placed during the hospital encounter of 04/28/12  POCT HEMOGLOBIN-HEMACUE      Result Value Range   Hemoglobin 13.1  13.0 - 17.0 g/dL     EKG/XRAY:   Primary read interpreted by Dr. Conley Rolls at [facility].   ASSESSMENT/PLAN: Encounter Diagnoses  Name Primary?  . Rash and nonspecific skin eruption Yes  . Dermatitis    Rx Bactrim ( precautions given about meds re sun exposure and also [name] rash )  Rx Triamcinolone Otc zyrtec in AM and Benadryl at night, no oral steroids for now Wound cx pending Warm compresses, no pus  drained out on I&D, just blood.  Gross sideeffects, risk and benefits, and alternatives of medications d/w patient. Patient is aware that all medications have potential sideeffects and we are unable to predict every sideeffect or drug-drug interaction that may occur.  Ciearra Rufo PHUONG, DO 08/14/2013 8:05 AM

## 2013-08-16 LAB — WOUND CULTURE
Gram Stain: NONE SEEN
Gram Stain: NONE SEEN

## 2013-08-29 ENCOUNTER — Telehealth: Payer: Self-pay | Admitting: Family Medicine

## 2013-08-29 NOTE — Telephone Encounter (Signed)
Spoke to pt about culture, doing better, finished bactrim and cream without problem.

## 2016-10-23 ENCOUNTER — Ambulatory Visit (INDEPENDENT_AMBULATORY_CARE_PROVIDER_SITE_OTHER): Payer: 59

## 2016-10-23 ENCOUNTER — Ambulatory Visit (INDEPENDENT_AMBULATORY_CARE_PROVIDER_SITE_OTHER): Payer: 59 | Admitting: Physician Assistant

## 2016-10-23 VITALS — BP 150/90 | HR 74 | Temp 98.2°F | Resp 17 | Ht 69.5 in | Wt 223.0 lb

## 2016-10-23 DIAGNOSIS — R030 Elevated blood-pressure reading, without diagnosis of hypertension: Secondary | ICD-10-CM

## 2016-10-23 DIAGNOSIS — I10 Essential (primary) hypertension: Secondary | ICD-10-CM

## 2016-10-23 DIAGNOSIS — R42 Dizziness and giddiness: Secondary | ICD-10-CM | POA: Diagnosis not present

## 2016-10-23 LAB — CBC WITH DIFFERENTIAL/PLATELET
BASOS PCT: 1 %
Basophils Absolute: 55 cells/uL (ref 0–200)
EOS ABS: 220 {cells}/uL (ref 15–500)
Eosinophils Relative: 4 %
HEMATOCRIT: 47.4 % (ref 38.5–50.0)
Hemoglobin: 15.9 g/dL (ref 13.2–17.1)
Lymphocytes Relative: 37 %
Lymphs Abs: 2035 cells/uL (ref 850–3900)
MCH: 29.7 pg (ref 27.0–33.0)
MCHC: 33.5 g/dL (ref 32.0–36.0)
MCV: 88.6 fL (ref 80.0–100.0)
MONO ABS: 495 {cells}/uL (ref 200–950)
MONOS PCT: 9 %
MPV: 9 fL (ref 7.5–12.5)
NEUTROS ABS: 2695 {cells}/uL (ref 1500–7800)
Neutrophils Relative %: 49 %
PLATELETS: 302 10*3/uL (ref 140–400)
RBC: 5.35 MIL/uL (ref 4.20–5.80)
RDW: 14.4 % (ref 11.0–15.0)
WBC: 5.5 10*3/uL (ref 3.8–10.8)

## 2016-10-23 LAB — POCT URINALYSIS DIP (MANUAL ENTRY)
BILIRUBIN UA: NEGATIVE
Bilirubin, UA: NEGATIVE
Glucose, UA: NEGATIVE
LEUKOCYTES UA: NEGATIVE
Nitrite, UA: NEGATIVE
PH UA: 6
PROTEIN UA: NEGATIVE
Urobilinogen, UA: 0.2

## 2016-10-23 LAB — LIPID PANEL
CHOLESTEROL: 221 mg/dL — AB (ref 125–200)
HDL: 58 mg/dL (ref >=40)
LDL CALC: 151 mg/dL — AB (ref <130)
TRIGLYCERIDES: 62 mg/dL (ref <150)
Total CHOL/HDL Ratio: 3.8 Ratio (ref <=5.0)
VLDL: 12 mg/dL (ref <30)

## 2016-10-23 LAB — COMPLETE METABOLIC PANEL WITH GFR
ALT: 14 U/L (ref 9–46)
AST: 19 U/L (ref 10–35)
Albumin: 4.5 g/dL (ref 3.6–5.1)
Alkaline Phosphatase: 56 U/L (ref 40–115)
BUN: 10 mg/dL (ref 7–25)
CHLORIDE: 104 mmol/L (ref 98–110)
CO2: 23 mmol/L (ref 20–31)
Calcium: 9.5 mg/dL (ref 8.6–10.3)
Creat: 0.99 mg/dL (ref 0.70–1.25)
GFR, EST NON AFRICAN AMERICAN: 82 mL/min (ref >=60)
Glucose, Bld: 89 mg/dL (ref 65–99)
Potassium: 4.5 mmol/L (ref 3.5–5.3)
Sodium: 138 mmol/L (ref 135–146)
Total Bilirubin: 0.8 mg/dL (ref 0.2–1.2)
Total Protein: 7.9 g/dL (ref 6.1–8.1)

## 2016-10-23 LAB — TSH: TSH: 1.86 mIU/L (ref 0.40–4.50)

## 2016-10-23 LAB — POC MICROSCOPIC URINALYSIS (UMFC): Mucus: ABSENT

## 2016-10-23 IMAGING — DX DG CHEST 2V
2 series · 2 of 2 positions shown · non-contrast
Comparison: No recent prior.

CLINICAL DATA: Lightheadedness.

EXAM:
CHEST  2 VIEW

[chest pa]
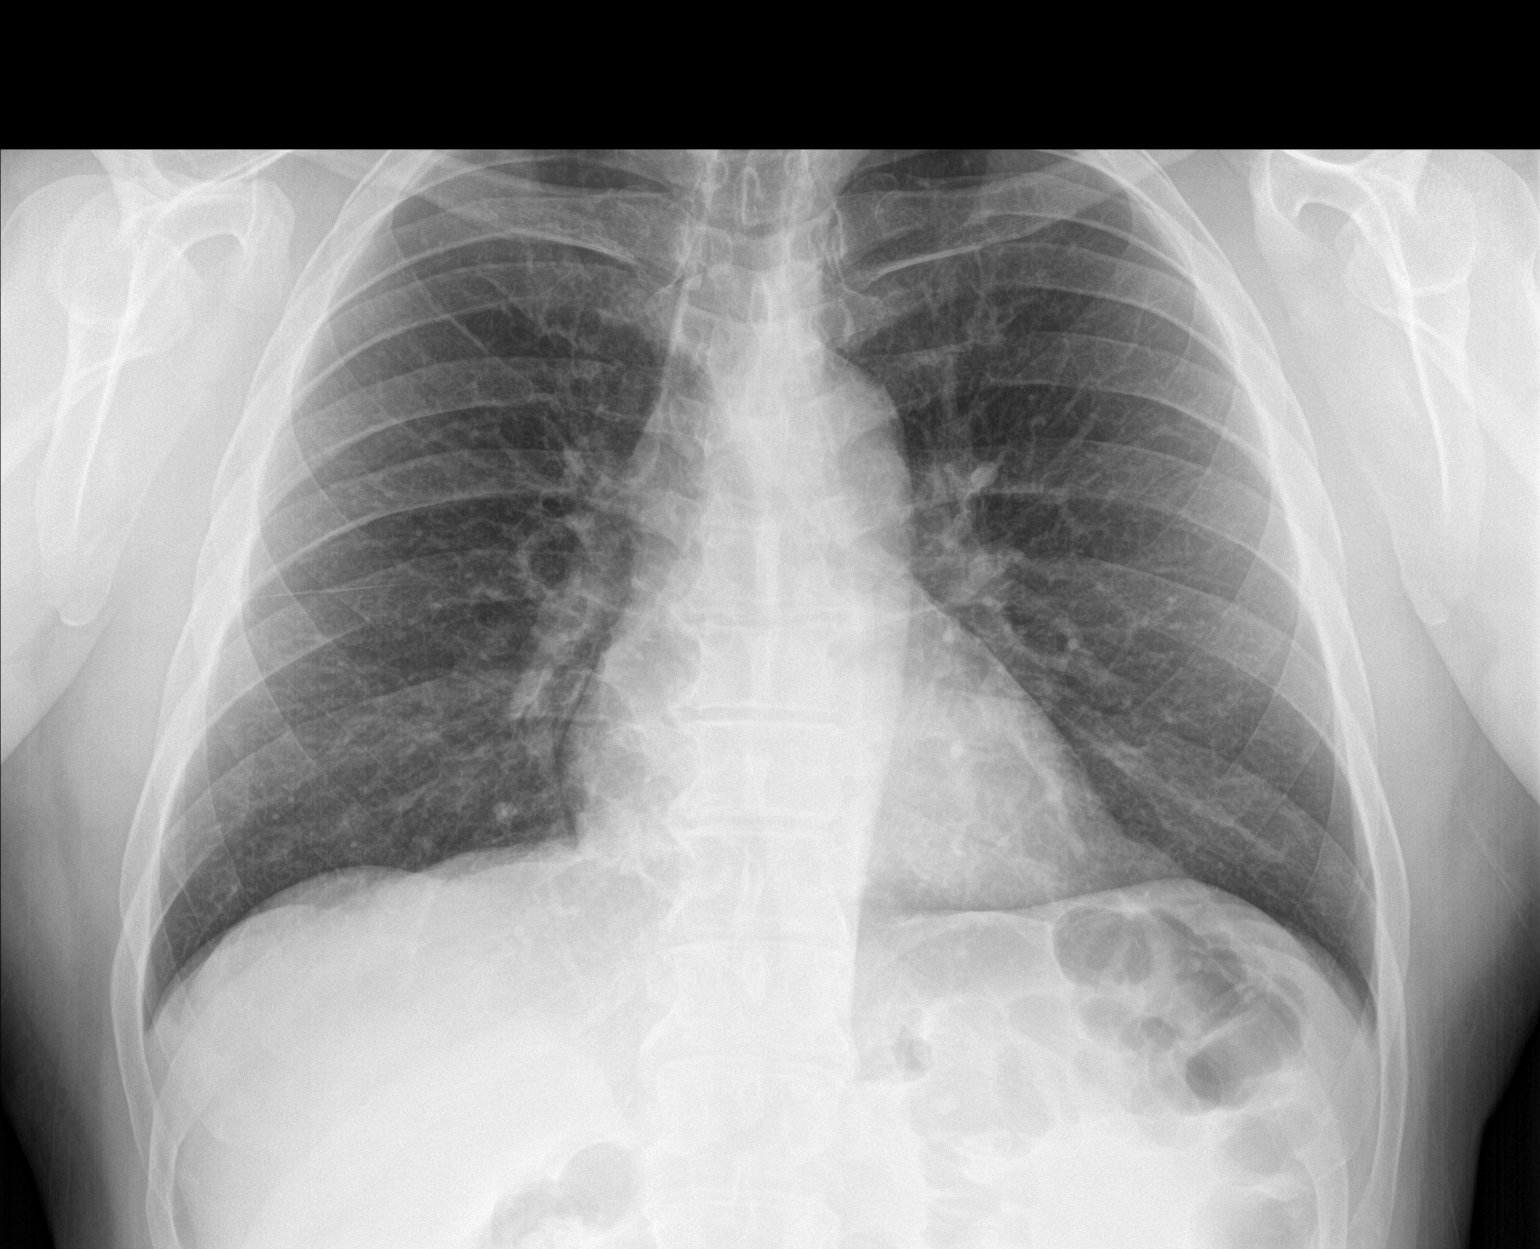

[chest lat]
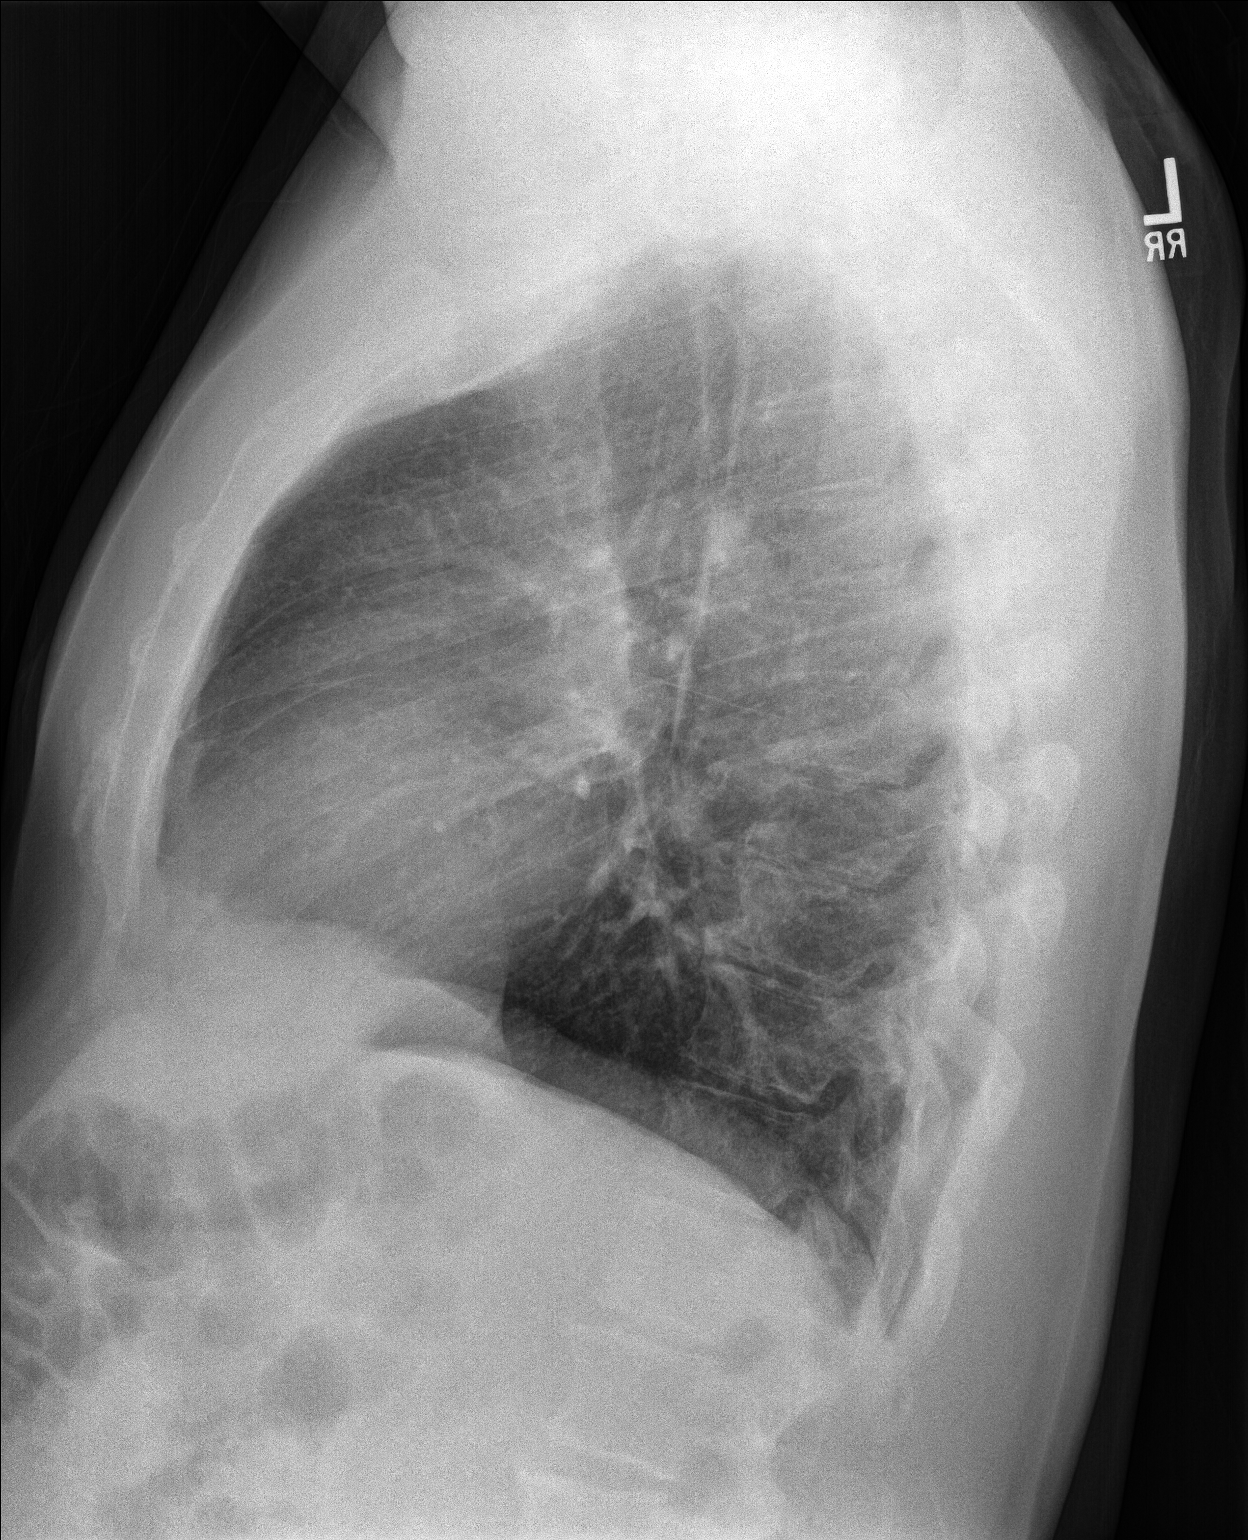

[2 of 2 positions shown; findings below may reference images not displayed]

FINDINGS: Mediastinum and hilar structures are normal. Lungs are clear of
acute infiltrates. Mild apical pleural thickening noted most
consistent with scarring. Heart size normal. Calcified pulmonary
nodule right lung base consistent with granuloma. Degenerative
changes thoracic spine .
IMPRESSION: 1. Calcified nodular density right lung base consistent with
granuloma.

2. No acute cardiopulmonary disease.

## 2016-10-23 MED ORDER — AMLODIPINE BESYLATE 5 MG PO TABS: 5.0000 mg | ORAL_TABLET | Freq: Every day | ORAL | 0 refills | Status: DC

## 2016-10-23 MED ORDER — HYDROCHLOROTHIAZIDE 12.5 MG PO CAPS: 12.5000 mg | ORAL_CAPSULE | Freq: Every day | ORAL | 0 refills | Status: DC

## 2016-10-23 NOTE — Patient Instructions (Addendum)
   Take both blood pressure medications daily. Continue to check bp while out of office. Goal is <140/90 and >100/60. Please check after you have been sitting quietly for five minutes. I would like you to follow up in one week for recheck in office. If symptoms worsen, follow up sooner.    Hypertension Hypertension is another name for high blood pressure. High blood pressure forces your heart to work harder to pump blood. A blood pressure reading has two numbers, which includes a higher number over a lower number (example: 110/72). HOME CARE   Have your blood pressure rechecked by your doctor.  Only take medicine as told by your doctor. Follow the directions carefully. The medicine does not work as well if you skip doses. Skipping doses also puts you at risk for problems.  Do not smoke.  Monitor your blood pressure at home as told by your doctor. GET HELP IF:  You think you are having a reaction to the medicine you are taking.  You have repeat headaches or feel dizzy.  You have puffiness (swelling) in your ankles.  You have trouble with your vision. GET HELP RIGHT AWAY IF:   You get a very bad headache and are confused.  You feel weak, numb, or faint.  You get chest or belly (abdominal) pain.  You throw up (vomit).  You cannot breathe very well. MAKE SURE YOU:   Understand these instructions.  Will watch your condition.  Will get help right away if you are not doing well or get worse.   This information is not intended to replace advice given to you by your health care provider. Make sure you discuss any questions you have with your health care provider.   Document Released: 06/01/2008 Document Revised: 12/19/2013 Document Reviewed: 10/06/2013 Elsevier Interactive Patient Education 2016 [employer].   IF you received an x-ray today, you will receive an invoice from [facility]. Please contact [facility] at [phone] with questions or concerns  regarding your invoice.   IF you received labwork today, you will receive an invoice from [employer]. Please contact [facility] at [phone] with questions or concerns regarding your invoice.   Our billing staff will not be able to assist you with questions regarding bills from these companies.  You will be contacted with the lab results as soon as they are available. The fastest way to get your results is to activate your My Chart account. Instructions are located on the last page of this paperwork. If you have not heard from [place] regarding the results in 2 weeks, please contact this office.

## 2016-10-23 NOTE — Progress Notes (Signed)
MRN: [id number] DOB: Feb 28, 1956  Subjective:   Justin Santos is a 60 y.o. male presenting for follow up on high blood pressure readings.    Pt was having a headache last week so he checked it yesterday with a cuff at home and it was 175/100. Got up this morning and went to [facility] and the reading was 191/103. Pt then came to our office.   Reports lightheadedness x few days, blurred vision, and heart racing last week. He is not having any symptoms today. Denies dizziness, double vision, chest pain, shortness of breath, palpitations, nausea, vomiting, abdominal pain, hematuria, lower leg swelling. Pt is a former smoker. He quit 20 years ago; prior to this he smoked 1.5 ppd for 4 years. Denies alcohol use. Denies any other aggravating or relieving factors, no other questions or concerns.  Justin Santos has a current medication list which includes the following prescription(s): fluticasone, sulfamethoxazole-trimethoprim, and triamcinolone cream. Also is allergic to peanuts [peanut oil].  Justin Santos  has a past medical history of Allergy; Environmental allergies; GERD (gastroesophageal reflux disease); and IBS (irritable bowel syndrome). Also  has a past surgical history that includes Upper gastrointestinal endoscopy and Colectomy (2001).  Objective:   Vitals: BP (!) 150/90 (BP Location: Right Arm)   Pulse 74   Temp 98.2 F (36.8 C) (Oral)   Resp 17   Ht 5' 9.5" (1.765 m)   Wt 223 lb (101.2 kg)   SpO2 98%   BMI 32.46 kg/m   Physical Exam  Constitutional: He is oriented to person, place, and time. He appears well-developed and well-nourished.  HENT:  Head: Normocephalic and atraumatic.  Eyes: Conjunctivae are normal. Pupils are equal, round, and reactive to light.  Neck: Normal range of motion.  Cardiovascular: Normal rate, regular rhythm, normal heart sounds and intact distal pulses.   Pulmonary/Chest: Effort normal and breath sounds normal.  Musculoskeletal:       Right lower leg: He  exhibits no swelling.       Left lower leg: He exhibits no swelling.  Neurological: He is alert and oriented to person, place, and time.  Skin: Skin is warm and dry.  Psychiatric: He has a normal mood and affect.  Vitals reviewed.  EKG shows sinus rhythm at 73 bpm with no acute ST or T wave changes. No prior EKG for comparison. EKG findings were presented to Dr. Brigitte Pulse.  Dg Chest 2 View  Result Date: 10/23/2016 CLINICAL DATA:  Lightheadedness. EXAM: CHEST  2 VIEW COMPARISON:  No recent prior. FINDINGS: Mediastinum and hilar structures are normal. Lungs are clear of acute infiltrates. Mild apical pleural thickening noted most consistent with scarring. Heart size normal. Calcified pulmonary nodule right lung base consistent with granuloma. Degenerative changes thoracic spine . IMPRESSION: 1. Calcified nodular density right lung base consistent with granuloma. 2. No acute cardiopulmonary disease. Electronically Signed   By: Marcello Moores  Register   On: 10/23/2016 11:30   Spoke with radiologist, Dr. Register, and he stated that no follow up images are reccommended for the CXR findings as it is a benign incidental finding.   Results for orders placed or performed in visit on 10/23/16 (from the past 24 hour(s))  POCT Microscopic Urinalysis ([facility])     Status: Abnormal   Collection Time: 10/23/16 11:39 AM  Result Value Ref Range   WBC,UR,HPF,POC None None WBC/hpf   RBC,UR,HPF,POC Few (A) None RBC/hpf   Bacteria Few (A) None, Too numerous to count   Mucus Absent Absent   Epithelial  Cells, UR Per Microscopy None None, Too numerous to count cells/hpf  POCT urinalysis dipstick     Status: Abnormal   Collection Time: 10/23/16 11:40 AM  Result Value Ref Range   Color, UA yellow yellow   Clarity, UA clear clear   Glucose, UA negative negative   Bilirubin, UA negative negative   Ketones, POC UA negative negative   Spec Grav, UA >=1.030    Blood, UA moderate (A) negative   pH, UA 6.0    Protein Ur, POC  negative negative   Urobilinogen, UA 0.2    Nitrite, UA Negative Negative   Leukocytes, UA Negative Negative    Assessment and Plan :  1. Blood pressure elevated without history of HTN -Instructed to start medications today and continue monitoring bp while out of the office. Goal is <140/90 and >100/60. Follow up in one week for reevaluation. If symptoms worsen, seek care sooner.  - EKG 12-Lead - DG Chest 2 View; Future - POCT Microscopic Urinalysis ([facility]) - POCT urinalysis dipstick - COMPLETE METABOLIC PANEL WITH GFR - CBC with Differential/Platelet - TSH - Lipid panel - hydrochlorothiazide (MICROZIDE) 12.5 MG capsule; Take 1 capsule (12.5 mg total) by mouth daily.  Dispense: 30 capsule; Refill: 0 - amLODipine (NORVASC) 5 MG tablet; Take 1 tablet (5 mg total) by mouth daily.  Dispense: 30 tablet; Refill: 0  Tenna Delaine, PA-C  [facility] 10/23/2016 11:50 AM

## 2016-10-28 ENCOUNTER — Ambulatory Visit (INDEPENDENT_AMBULATORY_CARE_PROVIDER_SITE_OTHER): Payer: 59 | Admitting: Physician Assistant

## 2016-10-28 DIAGNOSIS — I10 Essential (primary) hypertension: Secondary | ICD-10-CM | POA: Diagnosis not present

## 2016-10-28 MED ORDER — HYDROCHLOROTHIAZIDE 12.5 MG PO CAPS: 12.5000 mg | ORAL_CAPSULE | Freq: Every day | ORAL | 0 refills | Status: DC

## 2016-10-28 MED ORDER — AMLODIPINE BESYLATE 5 MG PO TABS
5.0000 mg | ORAL_TABLET | Freq: Every day | ORAL | 0 refills | Status: DC
Start: 2016-10-28 — End: 2018-04-15

## 2016-10-28 NOTE — Patient Instructions (Addendum)
Follow up in 2-4 weeks for annual physical exam. We will follow your bp at this time. Continue to monitor your bp while out of the office. If your bp reading is >150/90 you can take two of your amlodipine pills. Just watch out for signs of hypotension fatigue, lightheadedness, dizziness, and nausea. If your bp is >160/100, contact our office.   Please continue eating a healthy diet and exercising as your cholesterol was slightly elevated at the last visit. This can be lowered with strict dietary control. Try to lower the amount of saturated fats in your diet, which are found in red meat, butter, cheese, and coconut oil. Replace with monosaturated fats such as olive oil. You can also add sources of omega-3 fatty acids to your diet, like salmon and tuna. We will monitor this value at your next visit so make sure you are fasting then. If your cholesterol is still elevated, we will add on a statin medication at this time.  Thank you for letting me participate in your health and well being.    IF you received an x-ray today, you will receive an invoice from [facility]. Please contact [facility] at [phone] with questions or concerns regarding your invoice.   IF you received labwork today, you will receive an invoice from [employer]. Please contact [facility] at [phone] with questions or concerns regarding your invoice.   Our billing staff will not be able to assist you with questions regarding bills from these companies.  You will be contacted with the lab results as soon as they are available. The fastest way to get your results is to activate your My Chart account. Instructions are located on the last page of this paperwork. If you have not heard from [place] regarding the results in 2 weeks, please contact this office.

## 2016-10-28 NOTE — Progress Notes (Signed)
    MRN: [id number] DOB: [birth year]  Subjective:   Justin Santos is a 60 y.o. male presenting for follow up on Hypertension.   Currently managed with amlodipine 5mg  and hctz 12.5mg . Patient is checking blood pressure at home, range is [id number] systolic, with one reading in [id number] systolic. 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 Denies dizziness, chronic headache, double vision, chest pain, shortness of breath, heart racing, palpitations, nausea, vomiting, abdominal pain, hematuria, lower leg swelling. Denies current smoking. Denies any other aggravating or relieving factors, no other questions or concerns.  Diet: Since visit last week, he has quit eating potato chips and pizza. He is now eating more grilled/baked chicken and salad. He drinks mostly water now.   Exercise: He is lifting a lot at work.   Justin Santos has a current medication list which includes the following prescription(s): amlodipine, fluticasone, hydrochlorothiazide, sulfamethoxazole-trimethoprim, and triamcinolone cream. Also is allergic to peanuts [peanut oil].  Justin Santos  has a past medical history of Allergy; Environmental allergies; GERD (gastroesophageal reflux disease); and IBS (irritable bowel syndrome). Also  has a past surgical history that includes Upper gastrointestinal endoscopy and Colectomy (2001).  Objective:   Vitals: BP (!) 144/82 (BP Location: Right Arm, Patient Position: Sitting, Cuff Size: Small)   Pulse 84   Temp 98.6 F (37 C) (Oral)   Resp 16   Ht 5' 9.5" (1.765 m)   Wt 221 lb (100.2 kg)   SpO2 97%   BMI 32.17 kg/m   Physical Exam  Constitutional: He is oriented to person, place, and time. He appears well-developed and well-nourished.  HENT:  Head: Normocephalic and atraumatic.  Eyes: Conjunctivae are normal.  Neck: Normal range of motion.  Cardiovascular: Normal rate, regular rhythm, normal heart sounds and intact distal pulses.   Pulmonary/Chest:  Effort normal.  Musculoskeletal:       Right lower leg: He exhibits no swelling.       Left lower leg: He exhibits no swelling.  Neurological: He is alert and oriented to person, place, and time.  Skin: Skin is warm and dry.  Psychiatric: He has a normal mood and affect.  Vitals reviewed.   BP Readings from Last 3 Encounters:  10/28/16 (!) 144/82  10/23/16 (!) 150/90  08/14/13 132/80   No results found for this or any previous visit (from the past 24 hour(s)).  Assessment and Plan :  1. Essential hypertension -Continue medications and healthy lifestyle modifcations. Pt instructed that if bp reading is >150/90 he can take two amlodipine pills. Just watch out for signs of hypotension like fatigue, lightheadedness, dizziness, and nausea. If bp is >160/100, contact our office. - amLODipine (NORVASC) 5 MG tablet; Take 1 tablet (5 mg total) by mouth daily.  Dispense: 30 tablet; Refill: 0 - hydrochlorothiazide (MICROZIDE) 12.5 MG capsule; Take 1 capsule (12.5 mg total) by mouth daily.  Dispense: 30 capsule; Refill: 0 -Pt to return for annual physical exam in 3-4 weeks, will follow bp at this visit and recheck labs. If lipids still elevated at this visit will consider adding a statin to medication regimen.     Tenna Delaine, PA-C  [facility] 10/28/2016 5:07 PM

## 2018-04-14 ENCOUNTER — Encounter (HOSPITAL_COMMUNITY): Payer: Self-pay | Admitting: Emergency Medicine

## 2018-04-14 ENCOUNTER — Emergency Department (HOSPITAL_COMMUNITY)
Admission: EM | Admit: 2018-04-14 | Discharge: 2018-04-15 | Disposition: A | Discharge status: Home / Self Care | Payer: 59 | Attending: Emergency Medicine | Admitting: Emergency Medicine

## 2018-04-14 DIAGNOSIS — S0561XA Penetrating wound without foreign body of right eyeball, initial encounter: Secondary | ICD-10-CM | POA: Diagnosis not present

## 2018-04-14 DIAGNOSIS — I10 Essential (primary) hypertension: Secondary | ICD-10-CM | POA: Diagnosis not present

## 2018-04-14 DIAGNOSIS — S058X2A Other injuries of left eye and orbit, initial encounter: Secondary | ICD-10-CM | POA: Insufficient documentation

## 2018-04-14 DIAGNOSIS — Y92007 Garden or yard of unspecified non-institutional (private) residence as the place of occurrence of the external cause: Secondary | ICD-10-CM | POA: Insufficient documentation

## 2018-04-14 DIAGNOSIS — Y999 Unspecified external cause status: Secondary | ICD-10-CM | POA: Diagnosis not present

## 2018-04-14 DIAGNOSIS — W208XXA Other cause of strike by thrown, projected or falling object, initial encounter: Secondary | ICD-10-CM | POA: Diagnosis not present

## 2018-04-14 DIAGNOSIS — Z87891 Personal history of nicotine dependence: Secondary | ICD-10-CM | POA: Insufficient documentation

## 2018-04-14 DIAGNOSIS — H1132 Conjunctival hemorrhage, left eye: Secondary | ICD-10-CM | POA: Diagnosis not present

## 2018-04-14 DIAGNOSIS — Z79899 Other long term (current) drug therapy: Secondary | ICD-10-CM | POA: Insufficient documentation

## 2018-04-14 DIAGNOSIS — Y93H2 Activity, gardening and landscaping: Secondary | ICD-10-CM | POA: Diagnosis not present

## 2018-04-14 MED ORDER — TETRACAINE HCL 0.5 % OP SOLN
2.0000 [drp] | Freq: Once | OPHTHALMIC | Status: DC
  Filled 2018-04-14: qty 4

## 2018-04-14 MED ORDER — FLUORESCEIN SODIUM 1 MG OP STRP
1.0000 | ORAL_STRIP | Freq: Once | OPHTHALMIC | Status: DC
  Filled 2018-04-14: qty 1

## 2018-04-14 NOTE — ED Notes (Signed)
Pt upset over wait time. Pt updated on time and place on wait list.

## 2018-04-14 NOTE — ED Triage Notes (Signed)
Pt presents to ER with L eye injury where he was using weedeater and it threw a rock up and struck his eye; redness noted to sclera; pt reports blurred vision

## 2018-04-15 DIAGNOSIS — S058X2A Other injuries of left eye and orbit, initial encounter: Secondary | ICD-10-CM | POA: Diagnosis not present

## 2018-04-15 DIAGNOSIS — Z87891 Personal history of nicotine dependence: Secondary | ICD-10-CM | POA: Diagnosis not present

## 2018-04-15 DIAGNOSIS — Z79899 Other long term (current) drug therapy: Secondary | ICD-10-CM | POA: Diagnosis not present

## 2018-04-15 DIAGNOSIS — I10 Essential (primary) hypertension: Secondary | ICD-10-CM | POA: Diagnosis not present

## 2018-04-15 DIAGNOSIS — H1132 Conjunctival hemorrhage, left eye: Secondary | ICD-10-CM | POA: Diagnosis not present

## 2018-04-15 MED ORDER — FLUORESCEIN SODIUM 1 MG OP STRP
1.0000 | ORAL_STRIP | Freq: Once | OPHTHALMIC | Status: AC
  Administered 2018-04-15: 1 via OPHTHALMIC

## 2018-04-15 MED ORDER — AMLODIPINE BESYLATE 5 MG PO TABS: 5.0000 mg | ORAL_TABLET | Freq: Every day | ORAL | 0 refills | Status: DC

## 2018-04-15 MED ORDER — OFLOXACIN 0.3 % OP SOLN
1.0000 [drp] | OPHTHALMIC | Status: DC
  Administered 2018-04-15: 1 [drp] via OPHTHALMIC
  Filled 2018-04-15: qty 5

## 2018-04-15 MED ORDER — KETOROLAC TROMETHAMINE 0.5 % OP SOLN
1.0000 [drp] | Freq: Four times a day (QID) | OPHTHALMIC | Status: DC | PRN
  Filled 2018-04-15: qty 3

## 2018-04-15 MED ORDER — HYDROCHLOROTHIAZIDE 12.5 MG PO CAPS: 12.5000 mg | ORAL_CAPSULE | Freq: Every day | ORAL | 0 refills | Status: DC

## 2018-04-15 MED ORDER — TETRACAINE HCL 0.5 % OP SOLN
2.0000 [drp] | Freq: Once | OPHTHALMIC | Status: AC
  Administered 2018-04-15: 2 [drp] via OPHTHALMIC

## 2018-04-15 NOTE — ED Provider Notes (Addendum)
[name] Provider Note   CSN: [id number] Arrival date & time: 04/14/18  1928     History   Chief Complaint Chief Complaint  Patient presents with  . Eye Pain    L    HPI Justin Santos is a 62 y.o. male.  Patient presents to the emergency department for evaluation of left eye injury.  Injury occurred at approximately 4:30PM.  Patient reports that he was using a weedeater when something kicked up and hit him in the eye.  He was not wearing safety glasses.  He has had moderate pain and he has noticed blurred vision.     Past Medical History:  Diagnosis Date  . Allergy   . Environmental allergies   . GERD (gastroesophageal reflux disease)   . IBS (irritable bowel syndrome)     Patient Active Problem List   Diagnosis Date Noted  . Overweight 08/01/2013    Past Surgical History:  Procedure Laterality Date  . COLECTOMY  2001  . UPPER GASTROINTESTINAL ENDOSCOPY          Home Medications    Prior to Admission medications   Medication Sig Start Date End Date Taking? Authorizing Provider  loratadine (CLARITIN) 10 MG tablet Take 10 mg by mouth daily.   Yes [provider]  amLODipine (NORVASC) 5 MG tablet Take 1 tablet (5 mg total) by mouth daily. 04/15/18   Orpah Greek, MD  hydrochlorothiazide (MICROZIDE) 12.5 MG capsule Take 1 capsule (12.5 mg total) by mouth daily. 04/15/18   Orpah Greek, MD  sulfamethoxazole-trimethoprim (BACTRIM DS) 800-160 MG per tablet Take 1 tablet by mouth 2 (two) times daily. Patient not taking: Reported on 10/28/2016 08/14/13   Le, Thao P, DO  triamcinolone cream (KENALOG) 0.1 % Apply topically 3 (three) times daily. No more than 2 weeks. Patient not taking: Reported on 10/28/2016 08/14/13   Glenford Bayley, DO    Family History History reviewed. No pertinent family history.  Social History Social History   Tobacco Use  . Smoking status: Former Smoker    Last attempt to  quit: 04/26/1994    Years since quitting: 23.9  . Smokeless tobacco: Never Used  Substance Use Topics  . Alcohol use: No  . Drug use: No     Allergies   Peanuts [peanut oil]   Review of Systems Review of Systems  Eyes: Positive for pain, redness and visual disturbance.  All other systems reviewed and are negative.    Physical Exam Updated Vital Signs BP (!) 176/101 (BP Location: Left Arm)   Pulse 63   Temp 98.1 F (36.7 C) (Oral)   Resp 18   Ht 5\' 10"  (1.778 m)   Wt 99.8 kg (220 lb)   SpO2 95%   BMI 31.57 kg/m   Physical Exam  Constitutional: He is oriented to person, place, and time. He appears well-developed and well-nourished. No distress.  HENT:  Head: Normocephalic and atraumatic.  Right Ear: Hearing normal.  Left Ear: Hearing normal.  Nose: Nose normal.  Mouth/Throat: Oropharynx is clear and moist and mucous membranes are normal.  Eyes: Pupils are equal, round, and reactive to light. EOM are normal. Lids are everted and swept, no foreign bodies found. Left conjunctiva has a hemorrhage (Majority of the medial conjunctiva).  Intraocular pressure of left eye is 20  Fluorescein exam reveals normal cornea, no uptake, no abrasions, negative Seidel test  Pupil is round and reactive  No hyphema  Neck:  Normal range of motion. Neck supple.  Cardiovascular: Regular rhythm, S1 normal and S2 normal. Exam reveals no gallop and no friction rub.  No murmur heard. Pulmonary/Chest: Effort normal and breath sounds normal. No respiratory distress. He exhibits no tenderness.  Abdominal: Soft. Normal appearance and bowel sounds are normal. There is no hepatosplenomegaly. There is no tenderness. There is no rebound, no guarding, no tenderness at McBurney's point and negative Murphy's sign. No hernia.  Musculoskeletal: Normal range of motion.  Neurological: He is alert and oriented to person, place, and time. He has normal strength. No cranial nerve deficit or sensory deficit.  Coordination normal. GCS eye subscore is 4. GCS verbal subscore is 5. GCS motor subscore is 6.  Skin: Skin is warm, dry and intact. No rash noted. No cyanosis.  Psychiatric: He has a normal mood and affect. His speech is normal and behavior is normal. Thought content normal.  Nursing note and vitals reviewed.    ED Treatments / Results  Labs (all labs ordered are listed, but only abnormal results are displayed) Labs Reviewed - No data to display  EKG None  Radiology No results found.  Procedures Procedures (including critical care time)  Medications Ordered in ED Medications  ofloxacin (OCUFLOX) 0.3 % ophthalmic solution 1 drop (1 drop Left Eye Given 04/15/18 0233)  ketorolac (ACULAR) 0.5 % ophthalmic solution 1 drop (has no administration in time range)  fluorescein ophthalmic strip 1 strip (1 strip Left Eye Given by Other 04/15/18 0136)  tetracaine (PONTOCAINE) 0.5 % ophthalmic solution 2 drop (2 drops Left Eye Given by Other 04/15/18 0136)     Initial Impression / Assessment and Plan / ED Course  I have reviewed the triage vital signs and the nursing notes.  Pertinent labs & imaging results that were available during my care of the patient were reviewed by me and considered in my medical decision making (see chart for details).     Patient presents to the ER for evaluation of left eye injury.  Patient was struck with debris while using a weed eater.  Patient has a moderately sized subconjunctival hemorrhage of the medial aspect of the left eye.  There does not appear to be an open globe or injury to cornea, iris or anterior chamber.  Visual acuity is noted.  As patient is experiencing blurred vision with decreased visual acuity in the eye, I did discuss the patient with on-call ophthalmology, Dr. Noel Journey.  He will see the patient in the office this morning for further evaluation.   Patient will be treated with ofloxacin drops.  Patient noted to be hypertensive. He has h/o,  is off meds. Given refills.  Final Clinical Impressions(s) / ED Diagnoses   Final diagnoses:  Subconjunctival hemorrhage of left eye  Essential hypertension    ED Discharge Orders        Ordered    amLODipine (NORVASC) 5 MG tablet  Daily     04/15/18 0311    hydrochlorothiazide (MICROZIDE) 12.5 MG capsule  Daily     04/15/18 0311       Orpah Greek, MD 04/15/18 0310    Orpah Greek, MD 04/15/18 [phone]

## 2018-04-15 NOTE — Discharge Instructions (Addendum)
Dr. Noel Journey will call you this morning.  He will see you around 11 AM.  If you do not hear from him before that, please call his office to be seen.

## 2018-04-18 DIAGNOSIS — H1132 Conjunctival hemorrhage, left eye: Secondary | ICD-10-CM | POA: Diagnosis not present

## 2018-04-18 MED FILL — HYDROCHLOROTHIAZIDE 12.5 MG: 12.5 | 30 days supply | Qty: 30 | Fill #0

## 2018-04-18 MED FILL — AMLODIPINE BESYLATE 5 MG TA: 5 | 30 days supply | Qty: 30 | Fill #0

## 2018-05-31 ENCOUNTER — Ambulatory Visit (INDEPENDENT_AMBULATORY_CARE_PROVIDER_SITE_OTHER): Payer: 59 | Admitting: Physician Assistant

## 2018-05-31 ENCOUNTER — Other Ambulatory Visit: Payer: Self-pay

## 2018-05-31 ENCOUNTER — Other Ambulatory Visit: Payer: Self-pay | Admitting: Physician Assistant

## 2018-05-31 ENCOUNTER — Encounter: Payer: Self-pay | Admitting: Physician Assistant

## 2018-05-31 VITALS — BP 172/80 | HR 68 | Temp 98.6°F | Ht 70.08 in | Wt 218.4 lb

## 2018-05-31 DIAGNOSIS — Z130 Encounter for screening for diseases of the blood and blood-forming organs and certain disorders involving the immune mechanism: Secondary | ICD-10-CM

## 2018-05-31 DIAGNOSIS — Z0000 Encounter for general adult medical examination without abnormal findings: Secondary | ICD-10-CM | POA: Diagnosis not present

## 2018-05-31 DIAGNOSIS — Z23 Encounter for immunization: Secondary | ICD-10-CM | POA: Diagnosis not present

## 2018-05-31 DIAGNOSIS — Z125 Encounter for screening for malignant neoplasm of prostate: Secondary | ICD-10-CM | POA: Diagnosis not present

## 2018-05-31 DIAGNOSIS — Z1389 Encounter for screening for other disorder: Secondary | ICD-10-CM

## 2018-05-31 DIAGNOSIS — Z1329 Encounter for screening for other suspected endocrine disorder: Secondary | ICD-10-CM

## 2018-05-31 DIAGNOSIS — Z13228 Encounter for screening for other metabolic disorders: Secondary | ICD-10-CM | POA: Diagnosis not present

## 2018-05-31 DIAGNOSIS — Z13220 Encounter for screening for lipoid disorders: Secondary | ICD-10-CM

## 2018-05-31 DIAGNOSIS — R809 Proteinuria, unspecified: Secondary | ICD-10-CM | POA: Diagnosis not present

## 2018-05-31 DIAGNOSIS — I10 Essential (primary) hypertension: Secondary | ICD-10-CM

## 2018-05-31 DIAGNOSIS — R7309 Other abnormal glucose: Secondary | ICD-10-CM

## 2018-05-31 LAB — POCT URINALYSIS DIP (MANUAL ENTRY)
BILIRUBIN UA: NEGATIVE
Glucose, UA: NEGATIVE mg/dL
Nitrite, UA: NEGATIVE
PH UA: 6 (ref 5.0–8.0)
Protein Ur, POC: 30 mg/dL — AB
SPEC GRAV UA: 1.025 (ref 1.010–1.025)
Urobilinogen, UA: NEGATIVE E.U./dL — AB

## 2018-05-31 LAB — POC MICROSCOPIC URINALYSIS (UMFC): MUCUS RE: ABSENT

## 2018-05-31 MED ORDER — AMLODIPINE BESYLATE 10 MG PO TABS: 10.0000 mg | ORAL_TABLET | Freq: Every day | ORAL | 3 refills | Status: DC

## 2018-05-31 MED ORDER — HYDROCHLOROTHIAZIDE 12.5 MG PO CAPS: 12.5000 mg | ORAL_CAPSULE | Freq: Every day | ORAL | 3 refills | Status: DC

## 2018-05-31 MED ORDER — ZOSTER VAC RECOMB ADJUVANTED 50 MCG/0.5ML IM SUSR: 0.5000 mL | Freq: Once | INTRAMUSCULAR | 0 refills | Status: AC

## 2018-05-31 NOTE — Progress Notes (Signed)
Justin Santos  MRN: [id number] DOB: 04/01/1956  Subjective:  Pt is a 62 y.o. male who presents for annual physical exam.   Diet: Eats fish, chicken, salad, some fruits. Avoids fast foods. Drinks water.     Exercise: He is lifting a lot at work.   BM: daily  Sleep: 5 hours at night.   Urinary issues: Gets up at least once during the night time to urinate. Denies weak urinary stream, dribbling, and ED. No FH of prostate cancer.   Last dental exam:  Years ago Last vision exam: 4 weeks ago, normal   Last colonoscopy: 2018 Vaccinations      Tetanus: >10 years ago      Shingrix: Never    Chronic issues: HTN:  Currently managed with amlodipine '5mg'$  and hctz 12.'5mg'$ . Patient is checking blood pressure at home, range is 703-500X systolic. 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 Denies dizziness, chronic headache, double vision, chest pain, shortness of breath, heart racing, palpitations, nausea, vomiting, abdominal pain, hematuria, lower leg swelling. Denies current smoking.   Patient Active Problem List   Diagnosis Date Noted  . Overweight 08/01/2013    Current Outpatient Medications on File Prior to Visit  Medication Sig Dispense Refill  . loratadine (CLARITIN) 10 MG tablet Take 10 mg by mouth daily.    Marland Kitchen triamcinolone cream (KENALOG) 0.1 % Apply topically 3 (three) times daily. No more than 2 weeks. 60 g 1   No current facility-administered medications on file prior to visit.     Allergies  Allergen Reactions  . Peanuts [Peanut Oil] Itching    Rash and mouth/throat itching    Social History   Socioeconomic History  . Marital status: Married    Spouse name: Not on file  . Number of children: 2  . Years of education: Not on file  . Highest education level: Not on file  Occupational History  . Not on file  Social Needs  . Financial resource strain: Not on file  . Food insecurity:    Worry: Never true    Inability:  Never true  . Transportation needs:    Medical: No    Non-medical: No  Tobacco Use  . Smoking status: Former Smoker    Last attempt to quit: 04/26/1994    Years since quitting: 24.1  . Smokeless tobacco: Never Used  Substance and Sexual Activity  . Alcohol use: No  . Drug use: No  . Sexual activity: Yes    Partners: Female    Comment: with monogamous partner  Lifestyle  . Physical activity:    Days per week: 0 days    Minutes per session: 0 min  . Stress: Not on file  Relationships  . Social connections:    Talks on phone: More than three times a week    Gets together: Once a week    Attends religious service: More than 4 times per year    Active member of club or organization: Yes    Attends meetings of clubs or organizations: More than 4 times per year    Relationship status: Married  Other Topics Concern  . Not on file  Social History Narrative   Pt is from [place], which is where he currently lives with his wife. 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     Past Surgical History:  Procedure Laterality Date  . COLECTOMY  2001  . UPPER GASTROINTESTINAL ENDOSCOPY  Family History  Problem Relation Age of Onset  . Heart disease Father     Review of Systems  Constitutional: Negative for activity change, appetite change, chills, diaphoresis, fatigue, fever and unexpected weight change.  HENT: Negative for congestion, dental problem, drooling, ear discharge, ear pain, facial swelling, hearing loss, mouth sores, nosebleeds, postnasal drip, rhinorrhea, sinus pressure, sinus pain, sneezing, sore throat, tinnitus, trouble swallowing and voice change.   Eyes: Negative for photophobia, pain, discharge, redness, itching and visual disturbance.  Respiratory: Negative for apnea, cough, choking, chest tightness, shortness of breath, wheezing and stridor.   Cardiovascular: Negative for chest pain, palpitations and leg swelling.  Gastrointestinal: Negative for abdominal distention, abdominal  pain, anal bleeding, blood in stool, constipation, diarrhea, nausea, rectal pain and vomiting.  Endocrine: Negative for cold intolerance, heat intolerance, polydipsia, polyphagia and polyuria.  Genitourinary: Negative for decreased urine volume, difficulty urinating, discharge, dysuria, enuresis, flank pain, frequency, genital sores, hematuria, penile pain, penile swelling, scrotal swelling, testicular pain and urgency.  Musculoskeletal: Negative for arthralgias, back pain, gait problem, joint swelling, myalgias, neck pain and neck stiffness.  Skin: Negative for color change, pallor, rash and wound.  Allergic/Immunologic: Negative for environmental allergies, food allergies and immunocompromised state.  Neurological: Negative for dizziness, tremors, seizures, syncope, facial asymmetry, speech difficulty, weakness, light-headedness, numbness and headaches.  Hematological: Negative for adenopathy. Does not bruise/bleed easily.  Psychiatric/Behavioral: Negative for agitation, behavioral problems, confusion, decreased concentration, dysphoric mood, hallucinations, self-injury, sleep disturbance and suicidal ideas. The patient is not nervous/anxious and is not hyperactive.     Objective:  BP (!) 172/80 (BP Location: Left Arm, Patient Position: Sitting, Cuff Size: Large)   Pulse 68   Temp 98.6 F (37 C) (Oral)   Ht 5' 10.08" (1.78 m)   Wt 218 lb 6.4 oz (99.1 kg)   SpO2 97%   BMI 31.27 kg/m   Physical Exam  Constitutional: He is oriented to person, place, and time. He appears well-developed and well-nourished. No distress.  HENT:  Head: Normocephalic and atraumatic.  Right Ear: Hearing, tympanic membrane, external ear and ear canal normal.  Left Ear: Hearing, tympanic membrane, external ear and ear canal normal.  Nose: Nose normal.  Mouth/Throat: Uvula is midline, oropharynx is clear and moist and mucous membranes are normal. No oropharyngeal exudate.  Eyes: Pupils are equal, round, and  reactive to light. Conjunctivae and EOM are normal.  Fundoscopic exam:      The right eye shows no AV nicking, no hemorrhage and no papilledema. The right eye shows red reflex.       The left eye shows no AV nicking, no hemorrhage and no papilledema. The left eye shows red reflex.  Neck: Trachea normal and normal range of motion.  Cardiovascular: Normal rate, regular rhythm, normal heart sounds and intact distal pulses.  Pulmonary/Chest: Effort normal and breath sounds normal.  Abdominal: Soft. Normal appearance and bowel sounds are normal.  Old well healed scar noted on abdomen.   Musculoskeletal: Normal range of motion.       Right lower leg: He exhibits no edema.       Left lower leg: He exhibits no edema.  Lymphadenopathy:       Head (right side): No submental, no submandibular, no tonsillar, no preauricular, no posterior auricular and no occipital adenopathy present.       Head (left side): No submental, no submandibular, no tonsillar, no preauricular, no posterior auricular and no occipital adenopathy present.    He has no cervical adenopathy.  Right: No supraclavicular adenopathy present.       Left: No supraclavicular adenopathy present.  Neurological: He is alert and oriented to person, place, and time. He has normal strength and normal reflexes.  Skin: Skin is warm and dry.  Vitals reviewed.   Visual Acuity Screening   Right eye Left eye Both eyes  Without correction:     With correction: '20/15 20/20 20/20 '$     BP Readings from Last 3 Encounters:  05/31/18 (!) 172/80  04/15/18 (!) 188/97  10/28/16 (!) 144/82   Results for orders placed or performed in visit on 05/31/18 (from the past 24 hour(s))  POCT urinalysis dipstick     Status: Abnormal   Collection Time: 05/31/18 11:51 AM  Result Value Ref Range   Color, UA yellow yellow   Clarity, UA clear clear   Glucose, UA negative negative mg/dL   Bilirubin, UA negative negative   Ketones, POC UA trace (5) (A)  negative mg/dL   Spec Grav, UA 1.025 1.010 - 1.025   Blood, UA moderate (A) negative   pH, UA 6.0 5.0 - 8.0   Protein Ur, POC =30 (A) negative mg/dL   Urobilinogen, UA negative (A) 0.2 or 1.0 E.U./dL   Nitrite, UA Negative Negative   Leukocytes, UA  Negative  POCT Microscopic Urinalysis ([facility])     Status: None   Collection Time: 05/31/18 12:21 PM  Result Value Ref Range   WBC,UR,HPF,POC None None WBC/hpf   RBC,UR,HPF,POC None None RBC/hpf   Bacteria None None, Too numerous to count   Mucus Absent Absent   Epithelial Cells, UR Per Microscopy None None, Too numerous to count cells/hpf    EKG shows sinus bradycardia with rate of 58 bpm. PR and QRS intervals within normal limits. No acute ST or T wave abnormalities. No acute changes noted from prior EKG of 2017. Findings presented and discussed with Dr. Mitchel Honour.   Assessment and Plan :  Discussed healthy lifestyle, diet, exercise, preventative care, vaccinations, and addressed patient's concerns. Plan for follow up in 2 weeks. Otherwise, plan for specific conditions below.  1. Annual physical exam Await lab results.   2. Screening, anemia, deficiency, iron - CBC with Differential/Platelet  3. Screening for metabolic disorder - WHQ75+FFMB  4. Screening cholesterol level - Lipid panel  5. Screening PSA (prostate specific antigen) - PSA  6. Screening for thyroid disorder - TSH  7. Screening for hematuria or proteinuria - POCT urinalysis dipstick - POCT Microscopic Urinalysis ([facility])  8. Essential hypertension Asymptomatic. EKG normal. Plan to increase amlodipine from '5mg'$  to '10mg'$  and continue current HCTZ dose.  Instructed to check bp outside of office over the next couple of weeks. Return in 2 weeks for reevaluation.  Given strict ED precautions.  - CBC with Differential/Platelet - CMP14+EGFR - TSH - POCT urinalysis dipstick - EKG 12-Lead - amLODipine (NORVASC) 10 MG tablet; Take 1 tablet (10 mg total) by mouth daily.   Dispense: 90 tablet; Refill: 3 - hydrochlorothiazide (MICROZIDE) 12.5 MG capsule; Take 1 capsule (12.5 mg total) by mouth daily.  Dispense: 90 capsule; Refill: 3  9. Proteinuria, unspecified type Repeat UA in two weeks.   Tenna Delaine, PA-C  [facility] 05/31/2018 12:21 PM

## 2018-05-31 NOTE — Patient Instructions (Addendum)
In terms of elevated blood pressure, I would like you to increase your amlodipine to 10mg  daily, continue with HCTZ 12.5mg . Check your blood pressure at least a couple times over the next week outside of the office and document these values. It is best if you check the blood pressure at different times in the day. Your goal is <140/90. Return in 2 weeks for repeat blood pressure. If you start to have chest pain, blurred vision, shortness of breath, severe headache, lower leg swelling, or nausea/vomiting please seek care immediately here or at the ED.   For blood in the urine, I have placed a referral to urology. They should contact you within 2 weeks to schedule an appointment.   Health Maintenance, Male A healthy lifestyle and preventive care is important for your health and wellness. Ask your health care provider about what schedule of regular examinations is right for you. What should I know about weight and diet? Eat a Healthy Diet  Eat plenty of vegetables, fruits, whole grains, low-fat dairy products, and lean protein.  Do not eat a lot of foods high in solid fats, added sugars, or salt.  Maintain a Healthy Weight Regular exercise can help you achieve or maintain a healthy weight. You should:  Do at least 150 minutes of exercise each week. The exercise should increase your heart rate and make you sweat (moderate-intensity exercise).  Do strength-training exercises at least twice a week.  Watch Your Levels of Cholesterol and Blood Lipids  Have your blood tested for lipids and cholesterol every 5 years starting at 63 years of age. If you are at high risk for heart disease, you should start having your blood tested when you are 63 years old. You may need to have your cholesterol levels checked more often if: ? Your lipid or cholesterol levels are high. ? You are older than 63 years of age. ? You are at high risk for heart disease.  What should I know about cancer screening? Many types  of cancers can be detected early and may often be prevented. Lung Cancer  You should be screened every year for lung cancer if: ? You are a current smoker who has smoked for at least 30 years. ? You are a former smoker who has quit within the past 15 years.  Talk to your health care provider about your screening options, when you should start screening, and how often you should be screened.  Colorectal Cancer  Routine colorectal cancer screening usually begins at 63 years of age and should be repeated every 5-10 years until you are 63 years old. You may need to be screened more often if early forms of precancerous polyps or small growths are found. Your health care provider may recommend screening at an earlier age if you have risk factors for colon cancer.  Your health care provider may recommend using home test kits to check for hidden blood in the stool.  A small camera at the end of a tube can be used to examine your colon (sigmoidoscopy or colonoscopy). This checks for the earliest forms of colorectal cancer.  Prostate and Testicular Cancer  Depending on your age and overall health, your health care provider may do certain tests to screen for prostate and testicular cancer.  Talk to your health care provider about any symptoms or concerns you have about testicular or prostate cancer.  Skin Cancer  Check your skin from head to toe regularly.  Tell your health care provider about  any new moles or changes in moles, especially if: ? There is a change in a mole's size, shape, or color. ? You have a mole that is larger than a pencil eraser.  Always use sunscreen. Apply sunscreen liberally and repeat throughout the day.  Protect yourself by wearing long sleeves, pants, a wide-brimmed hat, and sunglasses when outside.  What should I know about heart disease, diabetes, and high blood pressure?  If you are 12-82 years of age, have your blood pressure checked every 3-5 years. If you  are 81 years of age or older, have your blood pressure checked every year. You should have your blood pressure measured twice-once when you are at a hospital or clinic, and once when you are not at a hospital or clinic. Record the average of the two measurements. To check your blood pressure when you are not at a hospital or clinic, you can use: ? An automated blood pressure machine at a pharmacy. ? A home blood pressure monitor.  Talk to your health care provider about your target blood pressure.  If you are between 45-98 years old, ask your health care provider if you should take aspirin to prevent heart disease.  Have regular diabetes screenings by checking your fasting blood sugar level. ? If you are at a normal weight and have a low risk for diabetes, have this test once every three years after the age of 71. ? If you are overweight and have a high risk for diabetes, consider being tested at a younger age or more often.  A one-time screening for abdominal aortic aneurysm (AAA) by ultrasound is recommended for men aged 61-75 years who are current or former smokers. What should I know about preventing infection? Hepatitis B If you have a higher risk for hepatitis B, you should be screened for this virus. Talk with your health care provider to find out if you are at risk for hepatitis B infection. Hepatitis C Blood testing is recommended for:  Everyone born from [birth year] through 1966.  Anyone with known risk factors for hepatitis C.  Sexually Transmitted Diseases (STDs)  You should be screened each year for STDs including gonorrhea and chlamydia if: ? You are sexually active and are younger than 63 years of age. ? You are older than 63 years of age and your health care provider tells you that you are at risk for this type of infection. ? Your sexual activity has changed since you were last screened and you are at an increased risk for chlamydia or gonorrhea. Ask your health care provider if  you are at risk.  Talk with your health care provider about whether you are at high risk of being infected with HIV. Your health care provider may recommend a prescription medicine to help prevent HIV infection.  What else can I do?  Schedule regular health, dental, and eye exams.  Stay current with your vaccines (immunizations).  Do not use any tobacco products, such as cigarettes, chewing tobacco, and e-cigarettes. If you need help quitting, ask your health care provider.  Limit alcohol intake to no more than 2 drinks per day. One drink equals 12 ounces of beer, 5 ounces of wine, or 1 ounces of hard liquor.  Do not use street drugs.  Do not share needles.  Ask your health care provider for help if you need support or information about quitting drugs.  Tell your health care provider if you often feel depressed.  Tell your health care provider  if you have ever been abused or do not feel safe at home. This information is not intended to replace advice given to you by your health care provider. Make sure you discuss any questions you have with your health care provider. Document Released: 06/11/2008 Document Revised: 08/12/2016 Document Reviewed: 09/17/2015 Elsevier Interactive Patient Education  2018 [employer].    IF you received an x-ray today, you will receive an invoice from [facility]. Please contact [facility] at [phone] with questions or concerns regarding your invoice.   IF you received labwork today, you will receive an invoice from [facility]. Please contact [facility] at [phone] with questions or concerns regarding your invoice.   Our billing staff will not be able to assist you with questions regarding bills from these companies.  You will be contacted with the lab results as soon as they are available. The fastest way to get your results is to activate your My Chart account. Instructions are located on the last page of this paperwork. If  you have not heard from [place] regarding the results in 2 weeks, please contact this office.

## 2018-06-01 DIAGNOSIS — R7309 Other abnormal glucose: Secondary | ICD-10-CM | POA: Diagnosis not present

## 2018-06-01 LAB — CBC WITH DIFFERENTIAL/PLATELET
BASOS: 1 %
Basophils Absolute: 0 10*3/uL (ref 0.0–0.2)
EOS (ABSOLUTE): 0.2 10*3/uL (ref 0.0–0.4)
Eos: 3 %
Hematocrit: 45.7 % (ref 37.5–51.0)
Hemoglobin: 15.2 g/dL (ref 13.0–17.7)
Immature Grans (Abs): 0 10*3/uL (ref 0.0–0.1)
Immature Granulocytes: 0 %
LYMPHS: 35 %
Lymphocytes Absolute: 2.1 10*3/uL (ref 0.7–3.1)
MCH: 29.3 pg (ref 26.6–33.0)
MCHC: 33.3 g/dL (ref 31.5–35.7)
MCV: 88 fL (ref 79–97)
MONOS ABS: 0.5 10*3/uL (ref 0.1–0.9)
Monocytes: 8 %
NEUTROS ABS: 3.2 10*3/uL (ref 1.4–7.0)
Neutrophils: 53 %
PLATELETS: 299 10*3/uL (ref 150–450)
RBC: 5.18 x10E6/uL (ref 4.14–5.80)
RDW: 15.3 % (ref 12.3–15.4)
WBC: 6 10*3/uL (ref 3.4–10.8)

## 2018-06-01 LAB — CMP14+EGFR
A/G RATIO: 1.5 (ref 1.2–2.2)
ALK PHOS: 59 IU/L (ref 39–117)
ALT: 18 IU/L (ref 0–44)
AST: 21 IU/L (ref 0–40)
Albumin: 4.7 g/dL (ref 3.6–4.8)
BILIRUBIN TOTAL: 1 mg/dL (ref 0.0–1.2)
BUN/Creatinine Ratio: 9 — ABNORMAL LOW (ref 10–24)
BUN: 10 mg/dL (ref 8–27)
CHLORIDE: 103 mmol/L (ref 96–106)
CO2: 21 mmol/L (ref 20–29)
Calcium: 9.9 mg/dL (ref 8.6–10.2)
Creatinine, Ser: 1.11 mg/dL (ref 0.76–1.27)
GFR calc non Af Amer: 71 mL/min/{1.73_m2} (ref >59)
GFR, EST AFRICAN AMERICAN: 82 mL/min/{1.73_m2} (ref >59)
GLUCOSE: 102 mg/dL — AB (ref 65–99)
Globulin, Total: 3.2 g/dL (ref 1.5–4.5)
POTASSIUM: 4.3 mmol/L (ref 3.5–5.2)
Sodium: 140 mmol/L (ref 134–144)
Total Protein: 7.9 g/dL (ref 6.0–8.5)

## 2018-06-01 LAB — LIPID PANEL
CHOLESTEROL TOTAL: 229 mg/dL — AB (ref 100–199)
Chol/HDL Ratio: 3.9 ratio (ref 0.0–5.0)
HDL: 59 mg/dL (ref >39)
LDL Calculated: 148 mg/dL — ABNORMAL HIGH (ref 0–99)
TRIGLYCERIDES: 108 mg/dL (ref 0–149)
VLDL Cholesterol Cal: 22 mg/dL (ref 5–40)

## 2018-06-01 LAB — PSA: PROSTATE SPECIFIC AG, SERUM: 2.8 ng/mL (ref 0.0–4.0)

## 2018-06-01 LAB — TSH: TSH: 1.55 u[IU]/mL (ref 0.450–4.500)

## 2018-06-01 NOTE — Addendum Note (Signed)
Addended by: Benson Setting L on: 06/01/2018 09:35 AM   Modules accepted: Orders

## 2018-06-02 LAB — HEMOGLOBIN A1C
Est. average glucose Bld gHb Est-mCnc: 134 mg/dL
HEMOGLOBIN A1C: 6.3 % — AB (ref 4.8–5.6)

## 2018-06-10 ENCOUNTER — Other Ambulatory Visit: Payer: Self-pay | Admitting: Physician Assistant

## 2018-06-10 ENCOUNTER — Encounter: Payer: Self-pay | Admitting: Radiology

## 2018-06-10 MED ORDER — ATORVASTATIN CALCIUM 20 MG PO TABS: 20.0000 mg | ORAL_TABLET | Freq: Every day | ORAL | 3 refills | Status: DC

## 2018-06-10 NOTE — Progress Notes (Signed)
Meds ordered this encounter  Medications  . atorvastatin (LIPITOR) 20 MG tablet    Sig: Take 1 tablet (20 mg total) by mouth daily.    Dispense:  90 tablet    Refill:  3    Order Specific Question:   Supervising Provider    Answer:   SMITH, KRISTI M [[id number]]    

## 2018-06-13 ENCOUNTER — Ambulatory Visit: Payer: 59 | Admitting: Physician Assistant

## 2018-06-13 ENCOUNTER — Other Ambulatory Visit: Payer: Self-pay

## 2018-06-13 ENCOUNTER — Encounter: Payer: Self-pay | Admitting: Physician Assistant

## 2018-06-13 VITALS — BP 127/77 | HR 85 | Temp 98.9°F | Resp 18 | Ht 69.88 in | Wt 221.2 lb

## 2018-06-13 DIAGNOSIS — E785 Hyperlipidemia, unspecified: Secondary | ICD-10-CM

## 2018-06-13 DIAGNOSIS — R809 Proteinuria, unspecified: Secondary | ICD-10-CM | POA: Diagnosis not present

## 2018-06-13 DIAGNOSIS — I10 Essential (primary) hypertension: Secondary | ICD-10-CM | POA: Diagnosis not present

## 2018-06-13 LAB — URINALYSIS, DIPSTICK ONLY
Bilirubin, UA: NEGATIVE
Glucose, UA: NEGATIVE
Ketones, UA: NEGATIVE
LEUKOCYTES UA: NEGATIVE
Nitrite, UA: NEGATIVE
PH UA: 5.5 (ref 5.0–7.5)
PROTEIN UA: NEGATIVE
Specific Gravity, UA: 1.017 (ref 1.005–1.030)
Urobilinogen, Ur: 0.2 mg/dL (ref 0.2–1.0)

## 2018-06-13 NOTE — Patient Instructions (Addendum)
Your blood pressure is very well controlled today.  Keep up the good work.  I recommend continuing taking amlodipine, HCTZ, and start Lipitor for cholesterol lowering benefit.  Lipitor is waiting for you at your pharmacy.  Continue checking your blood pressure at least 1-2 times per week.  If your blood pressure begins to go greater than 140/90, please continue with amlodipine 10 mg and increase to HCTZ from 12.5mg  to 25mg  daily.  Follow-up in 6 months for reevaluation.Thank you for letting me participate in your health and well being.    IF you received an x-ray today, you will receive an invoice from [facility]. Please contact [facility] at [phone] with questions or concerns regarding your invoice.   IF you received labwork today, you will receive an invoice from [facility]. Please contact [facility] at [phone] with questions or concerns regarding your invoice.   Our billing staff will not be able to assist you with questions regarding bills from these companies.  You will be contacted with the lab results as soon as they are available. The fastest way to get your results is to activate your My Chart account. Instructions are located on the last page of this paperwork. If you have not heard from [place] regarding the results in 2 weeks, please contact this office.

## 2018-06-13 NOTE — Progress Notes (Signed)
     MRN: [id number] DOB: October 30, 1956  Subjective:   Justin Santos is a 62 y.o. male presenting for follow up on Hypertension.  Patient last seen on 05/31/2018.  BP was elevated at 172/80.  He was asymptomatic.  Amlodipine was increased from 5 mg to 10 mg and continued HCTZ dose.  Encouraged follow-up in 2 weeks.  Labs also showed hyperlipidemia.  Lipitor was sent to his pharmacy and patient was notified via letter.  Today, patient notes he is taking amlodipine 10 mg and HCTZ 12.5 mg daily.  He did not pick up Lipitor because he was unaware it was at his pharmacy. Patient is checking blood pressure at home, range is 196Q systolic. Reports he is tolerating the increase in amlodipine fine. Denies lightheadedness, dizziness, chronic headache, double vision, chest pain, shortness of breath, heart racing, palpitations, nausea, vomiting, abdominal pain, hematuria, lower leg swelling. Denies any other aggravating or relieving factors, no other questions or concerns.  Justin Santos has a current medication list which includes the following prescription(s): amlodipine, hydrochlorothiazide, atorvastatin, loratadine, and triamcinolone cream. Also is allergic to peanuts [peanut oil].  Justin Santos  has a past medical history of Allergy, Environmental allergies, GERD (gastroesophageal reflux disease), Hypertension, and IBS (irritable bowel syndrome). Also  has a past surgical history that includes Upper gastrointestinal endoscopy and Colectomy (2001).   Objective:   Vitals: BP 127/77 (BP Location: Left Arm, Patient Position: Sitting, Cuff Size: Large)   Pulse 85   Temp 98.9 F (37.2 C) (Oral)   Resp 18   Ht 5' 9.88" (1.775 m)   Wt 221 lb 3.2 oz (100.3 kg)   SpO2 97%   BMI 31.85 kg/m   Physical Exam  Constitutional: He is oriented to person, place, and time. He appears well-developed and well-nourished. No distress.  HENT:  Head: Normocephalic and atraumatic.  Eyes: Conjunctivae are normal.  Neck: Normal range of  motion.  Pulmonary/Chest: Effort normal.  Musculoskeletal:       Right lower leg: He exhibits no edema.       Left lower leg: He exhibits no edema.  Neurological: He is alert and oriented to person, place, and time.  Skin: Skin is warm and dry.  Psychiatric: He has a normal mood and affect.  Vitals reviewed.   No results found for this or any previous visit (from the past 24 hour(s)).  BP Readings from Last 3 Encounters:  06/13/18 127/77  05/31/18 (!) 172/80  04/15/18 (!) 188/97    Assessment and Plan :  1. Essential hypertension BP is well controlled today.  Recommend continuing with amlodipine and HCTZ dose.  Encouraged to continue checking blood pressure outside the office.  If ever consistently > 140/90, patient can increase HCTZ dose from 12.5 mg to 25 mg daily.  Plan to follow-up in 6 months for reevaluation. - Urinalysis, dipstick only  2. Proteinuria, unspecified type Labs pending. - Urinalysis, dipstick only  3. Hyperlipidemia, unspecified hyperlipidemia type Encouraged to pick up Lipitor and take daily.  Side effects, risks, benefits, and alternatives of the medications and treatment plan prescribed today were discussed, and patient expressed understanding of the instructions given. No barriers to understanding were identified. Red flags discussed in detail. Pt expressed understanding regarding what to do in case of emergency/urgent symptoms.   Tenna Delaine, PA-C  [facility] 06/13/2018 8:44 AM

## 2019-03-06 ENCOUNTER — Other Ambulatory Visit: Payer: Self-pay

## 2019-03-06 ENCOUNTER — Emergency Department (HOSPITAL_COMMUNITY): Payer: Self-pay

## 2019-03-06 ENCOUNTER — Encounter: Payer: Self-pay | Admitting: Emergency Medicine

## 2019-03-06 ENCOUNTER — Encounter (HOSPITAL_COMMUNITY): Payer: Self-pay | Admitting: *Deleted

## 2019-03-06 ENCOUNTER — Emergency Department (HOSPITAL_COMMUNITY)
Admission: EM | Admit: 2019-03-06 | Discharge: 2019-03-06 | Disposition: A | Discharge status: Home / Self Care | Payer: Self-pay | Attending: Emergency Medicine | Admitting: Emergency Medicine

## 2019-03-06 ENCOUNTER — Ambulatory Visit: Payer: 59 | Admitting: Emergency Medicine

## 2019-03-06 VITALS — BP 201/100 | HR 60 | Temp 98.3°F | Ht 69.69 in | Wt 219.4 lb

## 2019-03-06 DIAGNOSIS — I10 Essential (primary) hypertension: Secondary | ICD-10-CM | POA: Diagnosis not present

## 2019-03-06 DIAGNOSIS — E1159 Type 2 diabetes mellitus with other circulatory complications: Secondary | ICD-10-CM | POA: Insufficient documentation

## 2019-03-06 DIAGNOSIS — R479 Unspecified speech disturbances: Secondary | ICD-10-CM | POA: Diagnosis not present

## 2019-03-06 DIAGNOSIS — R51 Headache: Secondary | ICD-10-CM | POA: Diagnosis not present

## 2019-03-06 DIAGNOSIS — R4781 Slurred speech: Secondary | ICD-10-CM | POA: Diagnosis not present

## 2019-03-06 DIAGNOSIS — R471 Dysarthria and anarthria: Secondary | ICD-10-CM | POA: Insufficient documentation

## 2019-03-06 DIAGNOSIS — I639 Cerebral infarction, unspecified: Secondary | ICD-10-CM | POA: Insufficient documentation

## 2019-03-06 DIAGNOSIS — Z7982 Long term (current) use of aspirin: Secondary | ICD-10-CM | POA: Insufficient documentation

## 2019-03-06 DIAGNOSIS — Z91010 Allergy to peanuts: Secondary | ICD-10-CM | POA: Insufficient documentation

## 2019-03-06 DIAGNOSIS — Z87891 Personal history of nicotine dependence: Secondary | ICD-10-CM | POA: Insufficient documentation

## 2019-03-06 DIAGNOSIS — M461 Sacroiliitis, not elsewhere classified: Secondary | ICD-10-CM | POA: Insufficient documentation

## 2019-03-06 DIAGNOSIS — I6930 Unspecified sequelae of cerebral infarction: Secondary | ICD-10-CM | POA: Diagnosis not present

## 2019-03-06 DIAGNOSIS — Z79899 Other long term (current) drug therapy: Secondary | ICD-10-CM | POA: Insufficient documentation

## 2019-03-06 DIAGNOSIS — Z8673 Personal history of transient ischemic attack (TIA), and cerebral infarction without residual deficits: Secondary | ICD-10-CM | POA: Insufficient documentation

## 2019-03-06 LAB — COMPREHENSIVE METABOLIC PANEL
ALT: 18 U/L (ref 0–44)
AST: 23 U/L (ref 15–41)
Albumin: 4.2 g/dL (ref 3.5–5.0)
Alkaline Phosphatase: 52 U/L (ref 38–126)
Anion gap: 9 (ref 5–15)
BUN: 7 mg/dL — ABNORMAL LOW (ref 8–23)
CHLORIDE: 107 mmol/L (ref 98–111)
CO2: 21 mmol/L — ABNORMAL LOW (ref 22–32)
Calcium: 9.3 mg/dL (ref 8.9–10.3)
Creatinine, Ser: 1.02 mg/dL (ref 0.61–1.24)
GFR calc Af Amer: >60 mL/min (ref >60)
GLUCOSE: 105 mg/dL — AB (ref 70–99)
Potassium: 4.2 mmol/L (ref 3.5–5.1)
Sodium: 137 mmol/L (ref 135–145)
TOTAL PROTEIN: 7.8 g/dL (ref 6.5–8.1)
Total Bilirubin: 1 mg/dL (ref 0.3–1.2)

## 2019-03-06 LAB — I-STAT CREATININE, ED: Creatinine, Ser: 1 mg/dL (ref 0.61–1.24)

## 2019-03-06 LAB — DIFFERENTIAL
Abs Immature Granulocytes: 0.01 10*3/uL (ref 0.00–0.07)
BASOS PCT: 1 %
Basophils Absolute: 0.1 10*3/uL (ref 0.0–0.1)
Eosinophils Absolute: 0.1 10*3/uL (ref 0.0–0.5)
Eosinophils Relative: 3 %
Immature Granulocytes: 0 %
Lymphocytes Relative: 35 %
Lymphs Abs: 1.7 10*3/uL (ref 0.7–4.0)
Monocytes Absolute: 0.5 10*3/uL (ref 0.1–1.0)
Monocytes Relative: 10 %
Neutro Abs: 2.5 10*3/uL (ref 1.7–7.7)
Neutrophils Relative %: 51 %

## 2019-03-06 LAB — CBC
HCT: 48.3 % (ref 39.0–52.0)
Hemoglobin: 15.6 g/dL (ref 13.0–17.0)
MCH: 28.2 pg (ref 26.0–34.0)
MCHC: 32.3 g/dL (ref 30.0–36.0)
MCV: 87.2 fL (ref 80.0–100.0)
Platelets: 308 10*3/uL (ref 150–400)
RBC: 5.54 MIL/uL (ref 4.22–5.81)
RDW: 13.8 % (ref 11.5–15.5)
WBC: 4.9 10*3/uL (ref 4.0–10.5)
nRBC: 0 % (ref 0.0–0.2)

## 2019-03-06 LAB — PROTIME-INR
INR: 1 (ref 0.8–1.2)
Prothrombin Time: 13.4 seconds (ref 11.4–15.2)

## 2019-03-06 LAB — APTT: APTT: 30 s (ref 24–36)

## 2019-03-06 LAB — SEDIMENTATION RATE: Sed Rate: 2 mm/hr (ref 0–16)

## 2019-03-06 IMAGING — MR MRI HEAD WITHOUT CONTRAST
9 of 11 series · 33 of 48 positions shown · non-contrast
Comparison: None.

CLINICAL DATA: Headache and speech difficulty

EXAM:
MRI HEAD WITHOUT CONTRAST
TECHNIQUE: Multiplanar, multiecho pulse sequences of the brain and surrounding
structures were obtained without intravenous contrast.

[Series 3: DWI · axial · 3.0mm · 0.94mm/px · z∈[-92,+64]mm · 7 of 108 slices shown (1 of 2)]
[im 1/108]
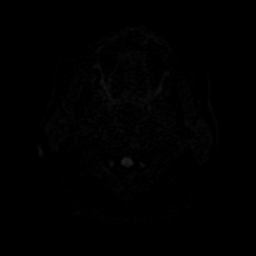
[im 18/108]
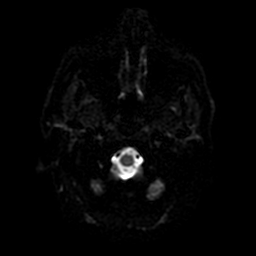
[im 36/108]
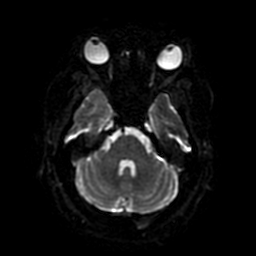
[im 54/108]
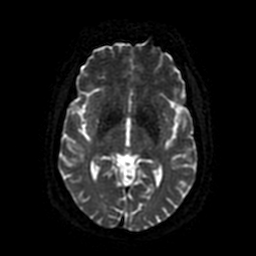
[im 72/108]
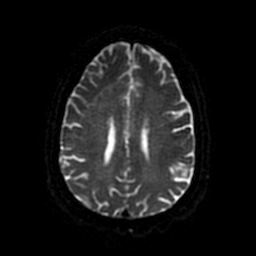
[im 90/108]
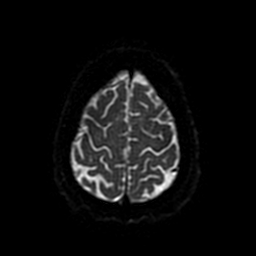
[im 108/108]
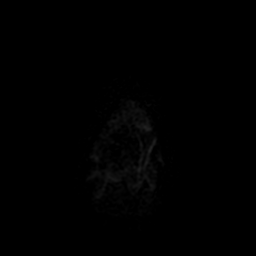

[Series 4: DWI · coronal · 4.0mm · 0.94mm/px · 6 of 82 slices shown (2 of 2)]
[im 1/82]
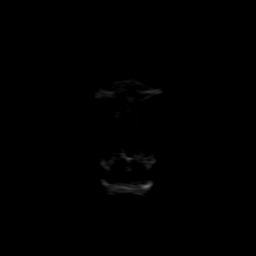
[im 17/82]
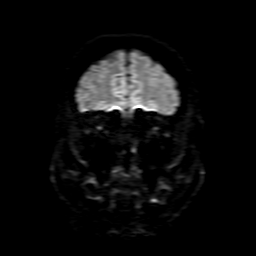
[im 33/82]
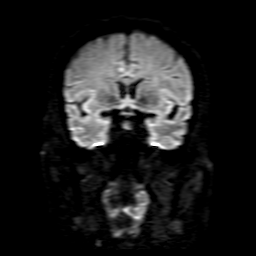
[im 49/82]
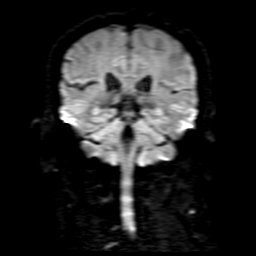
[im 65/82]
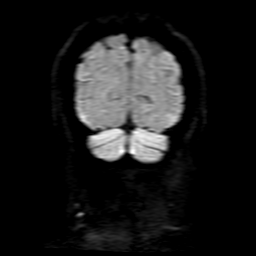
[im 82/82]
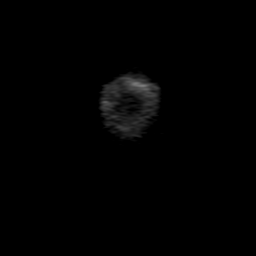

[Series 5: FLAIR · sagittal · 5.0mm · 0.47mm/px · 2 of 26 slices shown (1 of 2)]
[im 1/26]
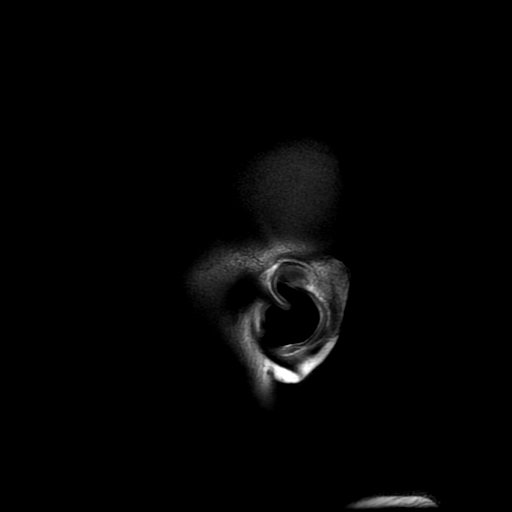
[im 26/26]
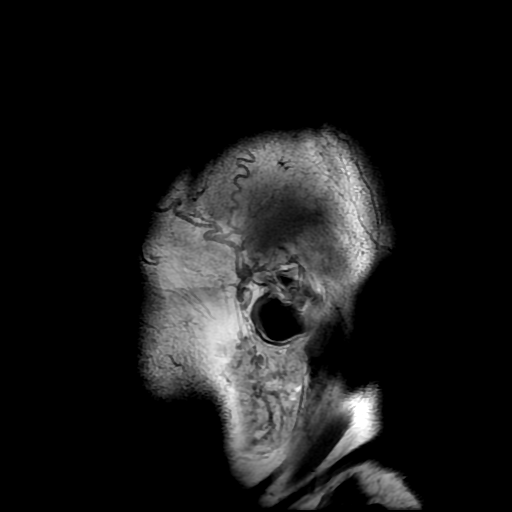

[Series 7: T2 · axial · 5.0mm · 0.47mm/px · z∈[-90,+62]mm · 2 of 27 slices shown (1 of 2)]
[im 1/27]
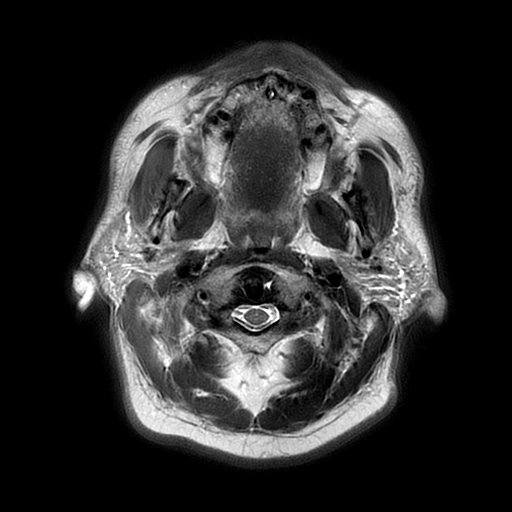
[im 27/27]
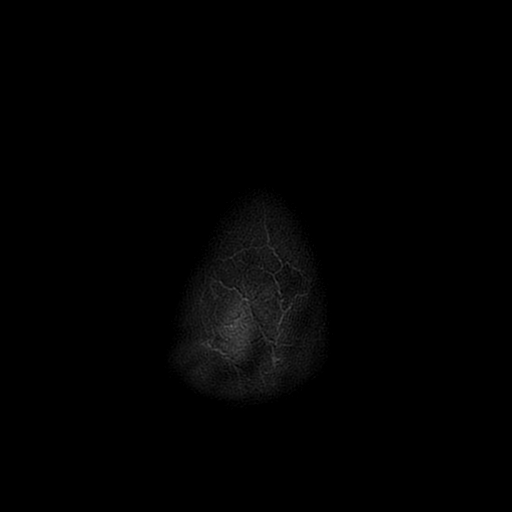

[Series 8: FLAIR · axial · 5.0mm · 0.47mm/px · z∈[-90,+62]mm · 2 of 27 slices shown (2 of 2)]
[im 1/27]
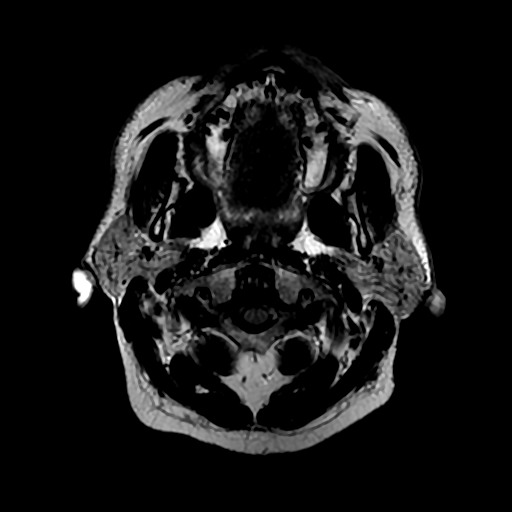
[im 27/27]
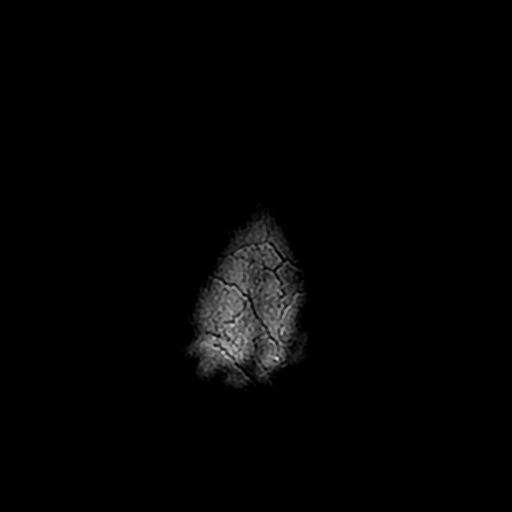

[Series 9: (person_name) · axial · 3.0mm · 0.47mm/px · z∈[-92,-2]mm · 5 of 108 slices shown]
[im 1/108]
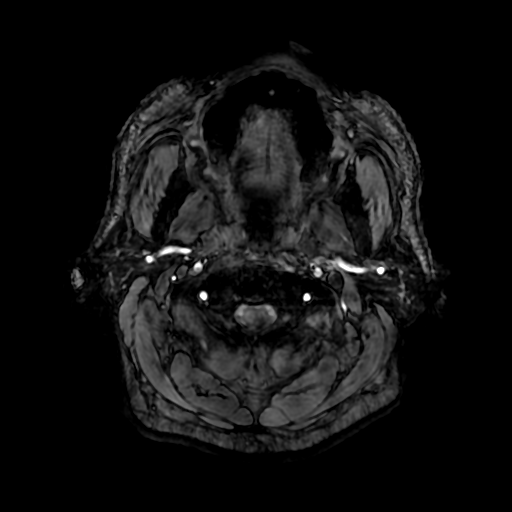
[im 16/108]
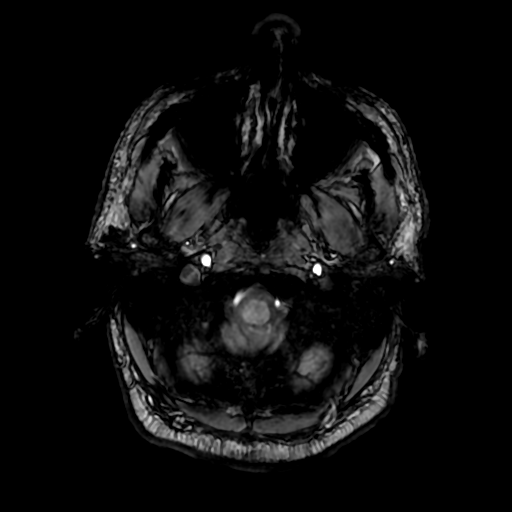
[im 31/108]
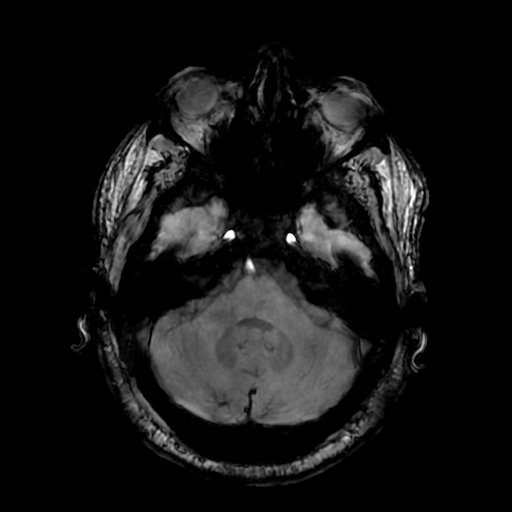
[im 46/108]
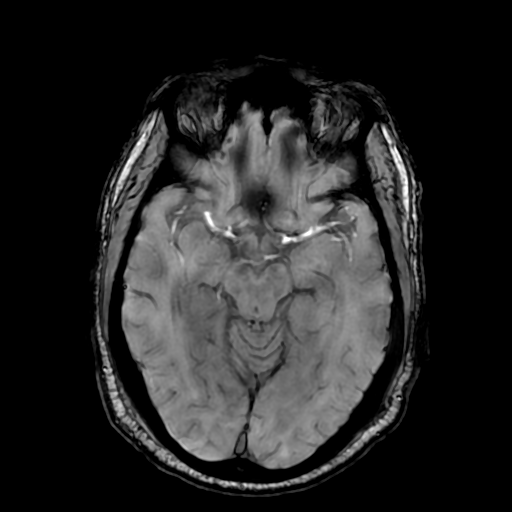
[im 62/108]
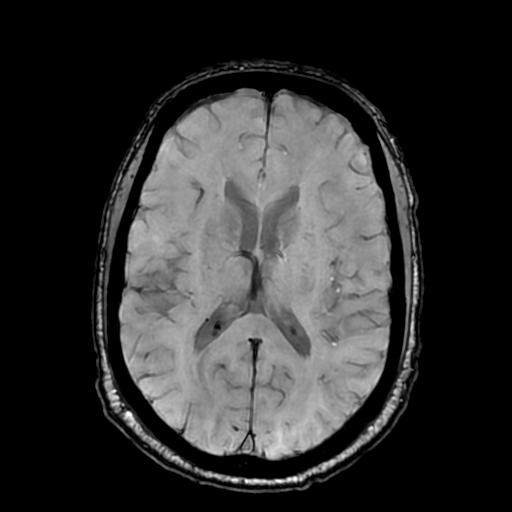

[Series 12: T2 · coronal · 5.0mm · 0.47mm/px · 2 of 34 slices shown (2 of 2)]
[im 1/34]
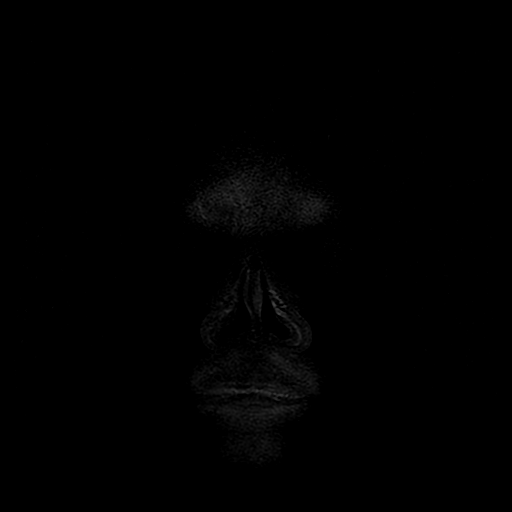
[im 34/34]
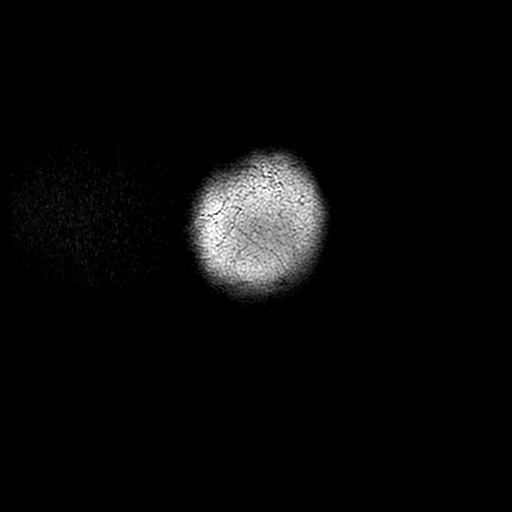

[Series 350: ADC · axial · 3.0mm · 0.94mm/px · z∈[-92,+64]mm · 4 of 54 slices shown (1 of 2)]
[im 1/54]
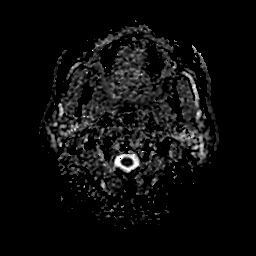
[im 18/54]
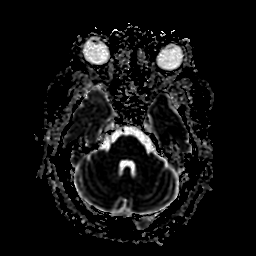
[im 36/54]
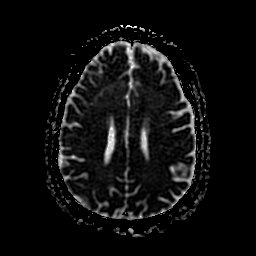
[im 54/54]
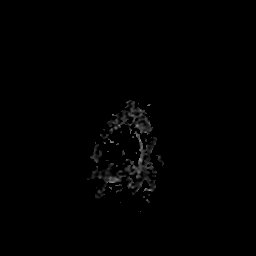

[Series 450: ADC · coronal · 4.0mm · 0.94mm/px · 3 of 41 slices shown (2 of 2)]
[im 1/41]
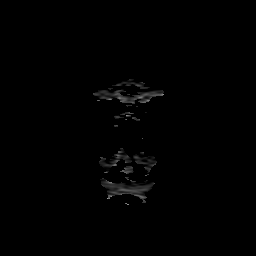
[im 21/41]
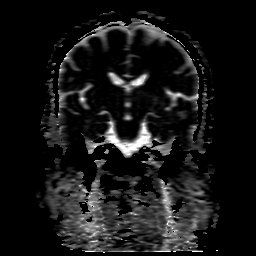
[im 41/41]
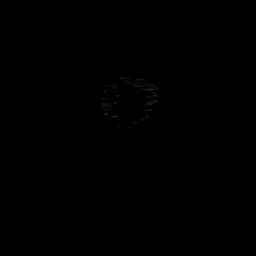

[33 of 48 positions shown; findings below may reference images not displayed]

FINDINGS: BRAIN: There is no acute infarct, acute hemorrhage, hydrocephalus or
extra-axial collection. The midline structures are normal. No
midline shift or other mass effect. Multifocal white matter
hyperintensity, most commonly due to chronic ischemic
microangiopathy. The cerebral and cerebellar volume are
age-appropriate. Susceptibility-sensitive sequences show no chronic
microhemorrhage or superficial siderosis.

VASCULAR: Major intracranial arterial and venous sinus flow voids
are normal.

SKULL AND UPPER CERVICAL SPINE: Calvarial bone marrow signal is
normal. There is no skull base mass. Visualized upper cervical spine
and soft tissues are normal.

SINUSES/ORBITS: Mild bilateral maxillary mucosal thickening. No
mastoid or middle ear effusion. The orbits are normal.
IMPRESSION: Mild chronic small vessel disease. No acute intracranial
abnormality.

## 2019-03-06 IMAGING — CT CT HEAD WITHOUT CONTRAST
4 series · 16 of 47 positions shown, 18 images · non-contrast
Comparison: [DATE]

CLINICAL DATA: Slurred speech. Hypertension. Right lower extremity
weakness

EXAM:
CT HEAD WITHOUT CONTRAST
TECHNIQUE: Contiguous axial images were obtained from the base of the skull
through the vertex without intravenous contrast.

[Series 3: head without · axial · non-contrast · 0.43mm/px · z∈[+1155,+1270]mm · 7 of 31 slices shown, 9 images]
[im 4/31  brain]
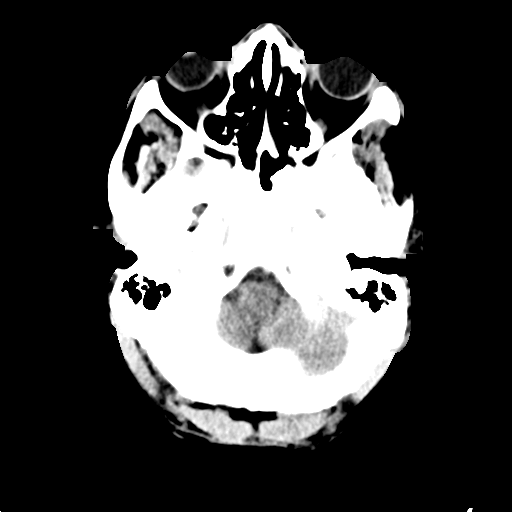
[im 4/31  bone]
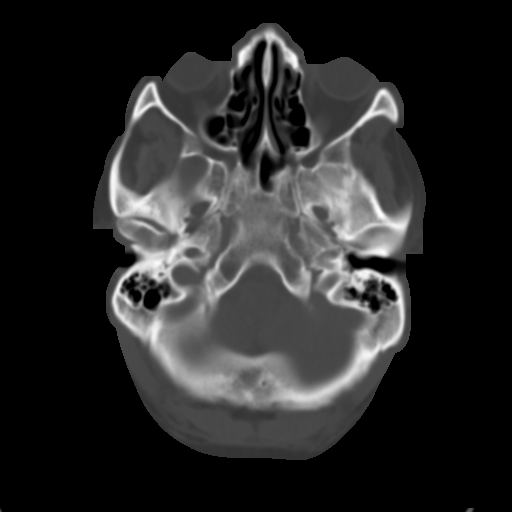
[im 8/31  brain]
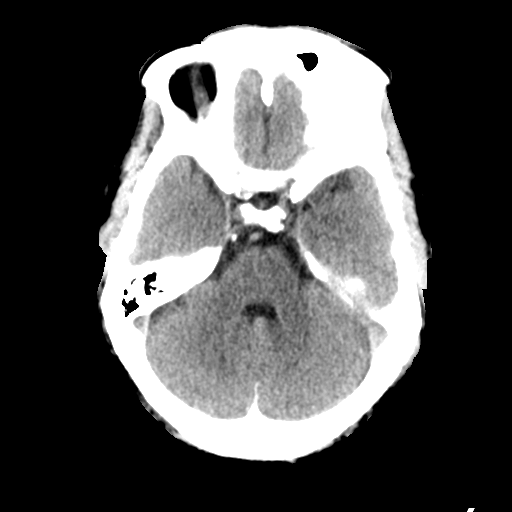
[im 12/31  brain]
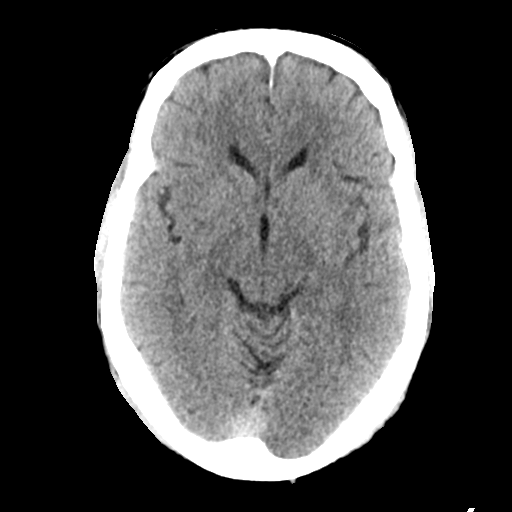
[im 16/31  brain]
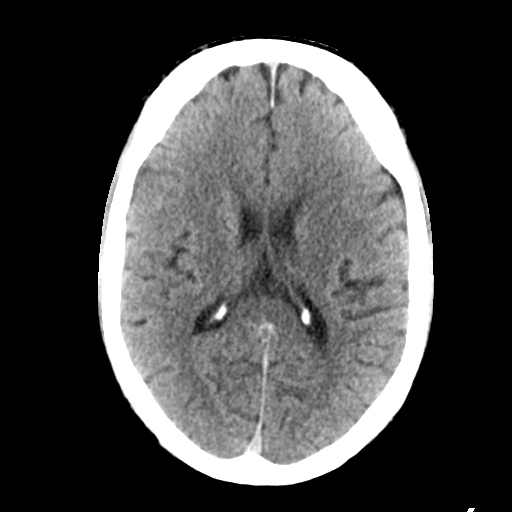
[im 19/31  brain]
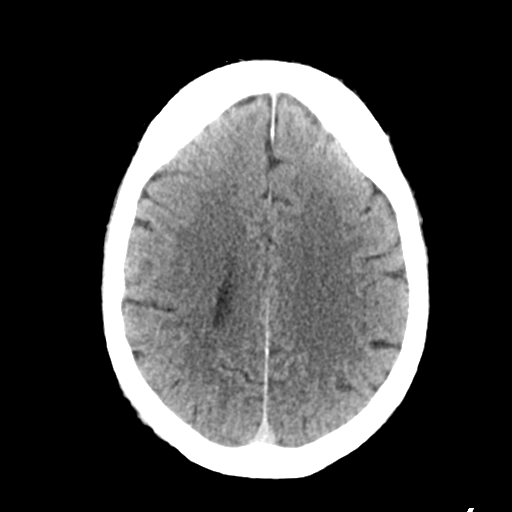
[im 19/31  bone]
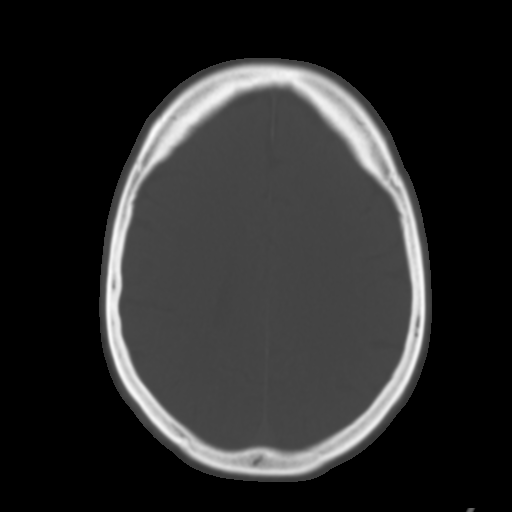
[im 23/31  brain]
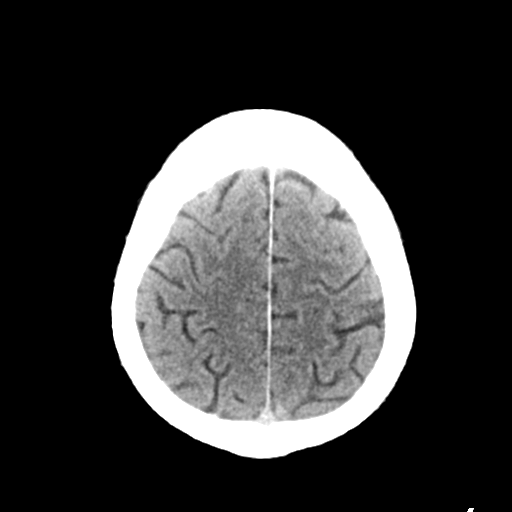
[im 27/31  brain]
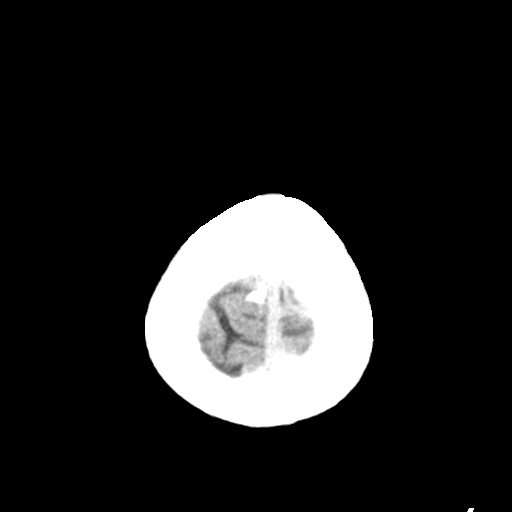

[Series 4: head bone · axial · 0.43mm/px · z∈[+1154,+1186]mm · 3 of 78 slices shown]
[im 8/78  bone]
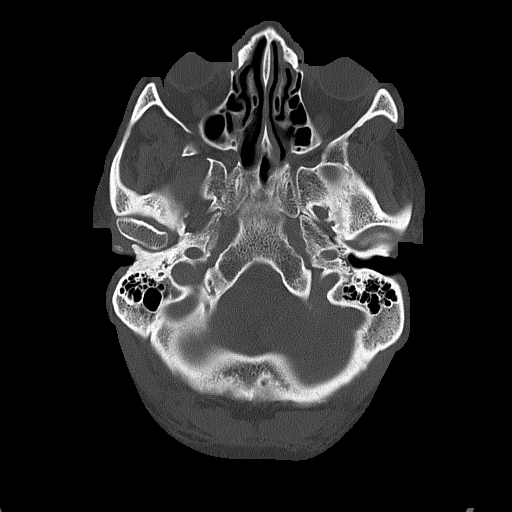
[im 16/78  bone]
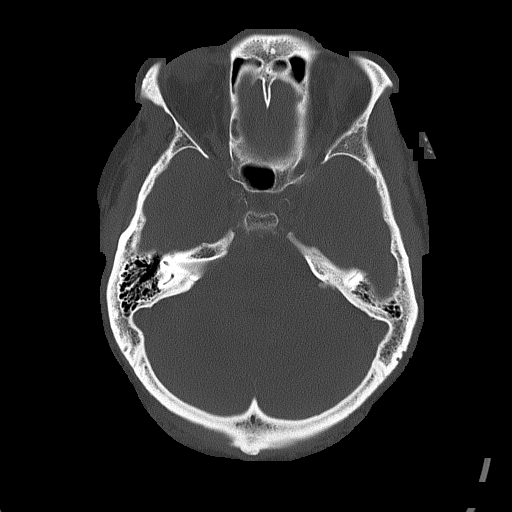
[im 24/78  bone]
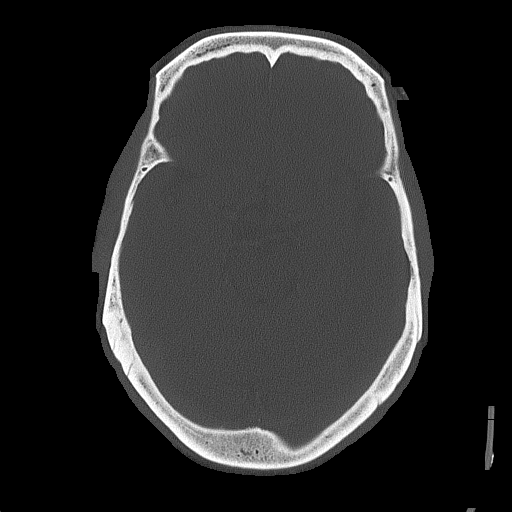

[Series 5: head without cor · coronal · non-contrast · 0.30mm/px · 3 of 72 slices shown]
[im 24/72  brain]
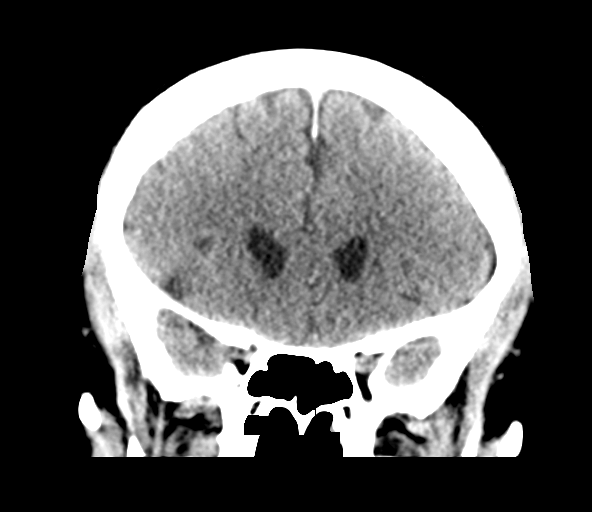
[im 32/72  brain]
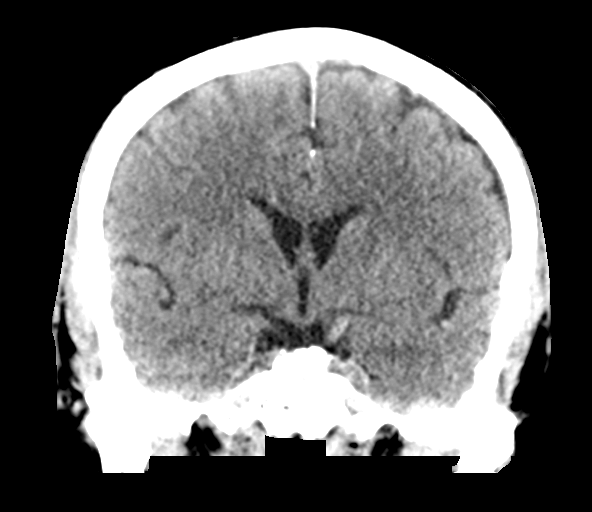
[im 40/72  brain]
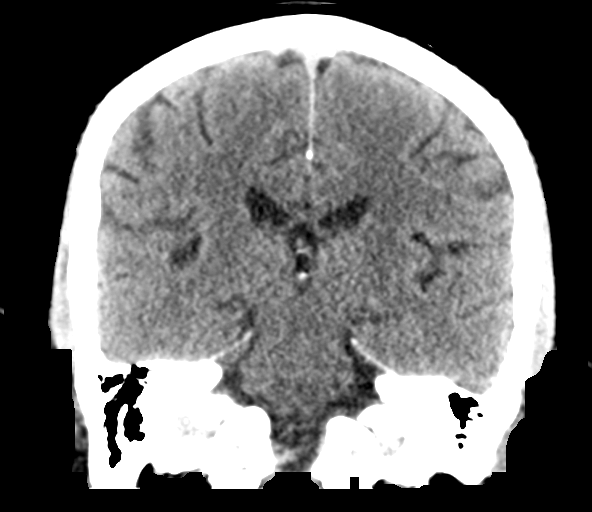

[Series 6: head without sag · sagittal · non-contrast · 0.30mm/px · 3 of 64 slices shown]
[im 22/64  brain]
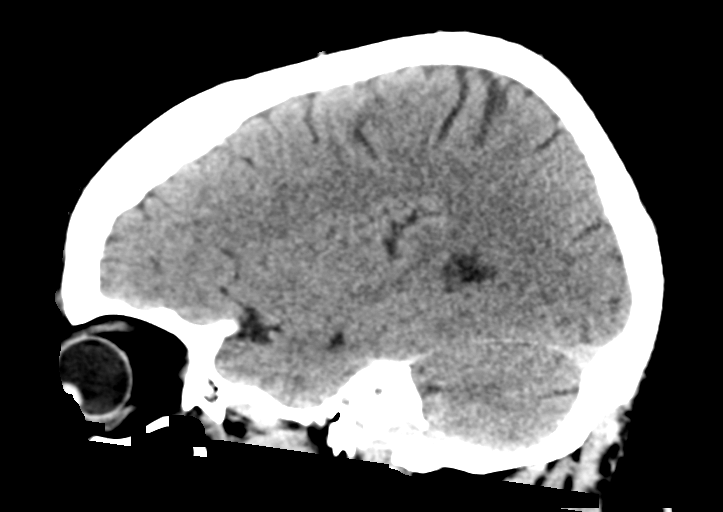
[im 32/64  brain]
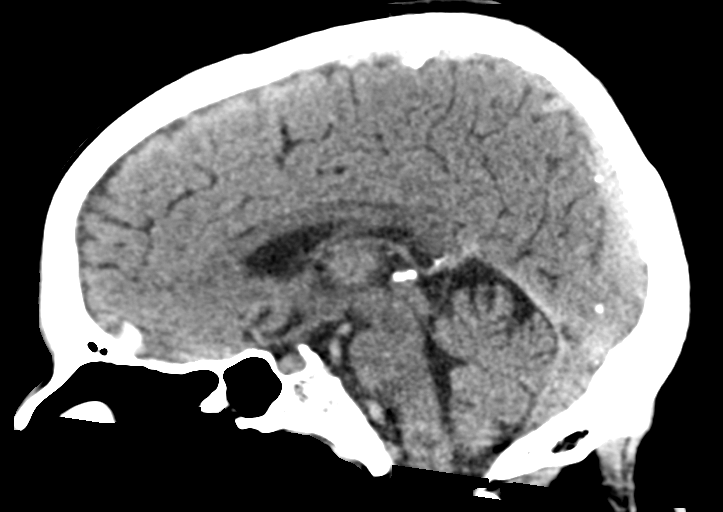
[im 43/64  brain]
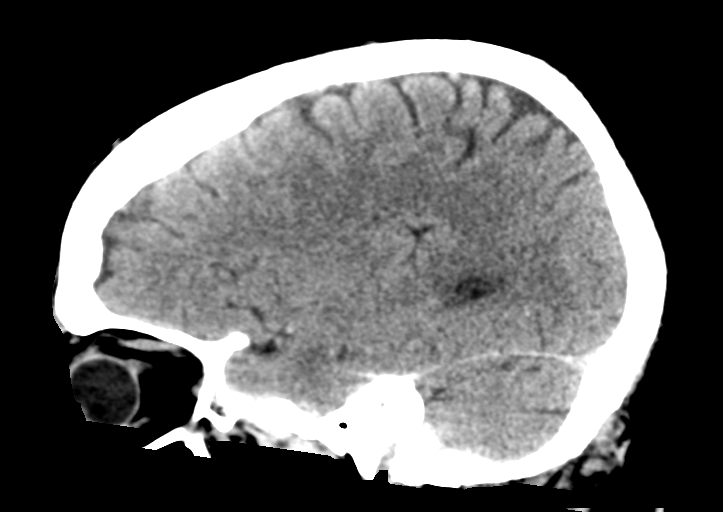

[16 of 47 positions shown; findings below may reference images not displayed]

FINDINGS: Brain: The ventricles are normal in size and configuration. There is
no intracranial mass, hemorrhage, extra-axial fluid collection, or
midline shift. Brain parenchyma appears unremarkable. No acute
infarct evident.

Vascular: There is no appreciable hyperdense vessel. There is
calcification in each carotid siphon region.

Skull: The bony calvarium appears intact.

Sinuses/Orbits: There is mucosal thickening in several ethmoid air
cells. There is slight mucosal thickening in the posterior right
maxillary antrum. Orbits appear symmetric bilaterally.

Other: Mastoid air cells are clear.
IMPRESSION: Brain parenchyma appears unremarkable.  No mass or hemorrhage.

There are foci of arterial vascular calcification. There are foci of
paranasal sinus disease.

## 2019-03-06 MED ORDER — SODIUM CHLORIDE 0.9% FLUSH: 3.0000 mL | Freq: Once | INTRAVENOUS | Status: DC

## 2019-03-06 MED ORDER — ASPIRIN 81 MG PO CHEW: 81.0000 mg | CHEWABLE_TABLET | Freq: Every day | ORAL | 0 refills | Status: AC

## 2019-03-06 MED ORDER — ACETAMINOPHEN 325 MG PO TABS
650.0000 mg | ORAL_TABLET | Freq: Once | ORAL | Status: AC
  Administered 2019-03-06: 650 mg via ORAL
  Filled 2019-03-06: qty 2

## 2019-03-06 NOTE — ED Triage Notes (Signed)
Pt in c/o right leg weakness for the last week, pt has had intermittent dysphagia as well, went to his PCP and was sent here, denies weakness to his right arm, symptoms in his leg have been constant since they started

## 2019-03-06 NOTE — ED Notes (Signed)
Patient to MRI.

## 2019-03-06 NOTE — ED Provider Notes (Signed)
[facility]ER[facility]ENC[facility] DEPARTMENT Provider Note   CSN: [id number] Arrival date & time: 03/06/19  1101    History   Chief Complaint Chief Complaint  Patient presents with  . Cerebrovascular Accident    HPI Justin Santos is a 63 y.o. male.     Patient is a 63 year old male with history of hypertension who presents the emergency department complaining of right lower extremity weakness, headache, and expressive aphasia intermittently over the course of the last 3 days.  Patient states his right lower extremity weakness typically gets better with ambulation.  He states that initially when he goes to move he feels weak however improves the longer he is mobile.  He has recently been bit down on his right knee while working on a deck.  Patient states his headache is located behind his bilateral eyes with intermittent blurred vision.  Describes pain as a pressure-like sensation throughout his head which is worsened with lights.  Patient also states he has had intermittent stuttering over the past few days which is what concerned his wife enough to have him come to the emergency department.  He was states he has had migraines as a younger man however the symptoms do not feel as intense.  Of note, the patient has not been taking his antihypertensives over the past 2 weeks as he states he felt like he did not need to take them.  He denies any chest pain, shortness of breath, nausea/vomiting/diarrhea, or changes in bowel or bladder habits.   Illness  Severity:  Moderate Onset quality:  Gradual Duration:  3 days Timing:  Intermittent Progression:  Improving Chronicity:  New Associated symptoms: headaches   Associated symptoms: no abdominal pain, no chest pain, no cough, no ear pain, no fever, no rash, no shortness of breath, no sore throat and no vomiting     Past Medical History:  Diagnosis Date  . Allergy   . Environmental allergies   . GERD (gastroesophageal reflux disease)    . Hypertension   . IBS (irritable bowel syndrome)     Patient Active Problem List   Diagnosis Date Noted  . Uncontrolled hypertension 03/06/2019  . Cerebrovascular accident (CVA) ([place]) 03/06/2019  . Dysarthria 03/06/2019  . Overweight 08/01/2013    Past Surgical History:  Procedure Laterality Date  . COLECTOMY  2001  . UPPER GASTROINTESTINAL ENDOSCOPY          Home Medications    Prior to Admission medications   Medication Sig Start Date End Date Taking? Authorizing Provider  acetaminophen (TYLENOL) 500 MG tablet Take 500-1,000 mg by mouth every 6 (six) hours as needed for mild pain or headache.   Yes [provider]  amLODipine (NORVASC) 10 MG tablet Take 1 tablet (10 mg total) by mouth daily. 05/31/18  Yes Timmothy Euler, [name], PA-C  cetirizine (ZYRTEC) 10 MG tablet Take 10 mg by mouth daily as needed for allergies or rhinitis.   Yes [provider]  hydrochlorothiazide (MICROZIDE) 12.5 MG capsule Take 1 capsule (12.5 mg total) by mouth daily. 05/31/18  Yes Tenna Delaine D, PA-C  aspirin 81 MG chewable tablet Chew 1 tablet (81 mg total) by mouth daily for 30 doses. 03/06/19 04/05/19  Tommie Raymond, MD  atorvastatin (LIPITOR) 20 MG tablet Take 1 tablet (20 mg total) by mouth daily. Patient not taking: Reported on 03/06/2019 06/10/18   Tenna Delaine D, PA-C  triamcinolone cream (KENALOG) 0.1 % Apply topically 3 (three) times daily. No more than 2 weeks.  Patient not taking: Reported on 03/06/2019 08/14/13   Glenford Bayley, DO    Family History Family History  Problem Relation Age of Onset  . Heart disease Father     Social History Social History   Tobacco Use  . Smoking status: Former Smoker    Last attempt to quit: 04/26/1994    Years since quitting: 24.8  . Smokeless tobacco: Never Used  Substance Use Topics  . Alcohol use: No  . Drug use: No     Allergies   Peanuts [peanut oil] and Azithromycin   Review of Systems Review of Systems    Constitutional: Negative for chills and fever.  HENT: Negative for ear pain and sore throat.   Eyes: Positive for photophobia and visual disturbance. Negative for pain.  Respiratory: Negative for cough and shortness of breath.   Cardiovascular: Negative for chest pain and palpitations.  Gastrointestinal: Negative for abdominal pain and vomiting.  Genitourinary: Negative for dysuria and hematuria.  Musculoskeletal: Negative for arthralgias and back pain.  Skin: Negative for color change and rash.  Neurological: Positive for weakness (RLE) and headaches. Negative for seizures and syncope.       Expressive aphasia.   All other systems reviewed and are negative.    Physical Exam Updated Vital Signs BP (!) 159/90 (BP Location: Left Arm)   Pulse (!) 58   Temp 97.8 F (36.6 C) (Oral)   Resp 16   SpO2 96%   Physical Exam Vitals signs and nursing note reviewed.  Constitutional:      Appearance: He is well-developed.  HENT:     Head: Normocephalic and atraumatic.     Comments: Mild tenderness overlying bilateral temples.  Eyes:     Conjunctiva/sclera: Conjunctivae normal.  Neck:     Musculoskeletal: Neck supple.  Cardiovascular:     Rate and Rhythm: Normal rate and regular rhythm.     Heart sounds: No murmur.  Pulmonary:     Effort: Pulmonary effort is normal. No respiratory distress.     Breath sounds: Normal breath sounds.  Abdominal:     Palpations: Abdomen is soft.     Tenderness: There is no abdominal tenderness.  Skin:    General: Skin is warm and dry.  Neurological:     General: No focal deficit present.     Mental Status: He is alert and oriented to person, place, and time.     Cranial Nerves: No cranial nerve deficit.     Sensory: No sensory deficit.     Motor: No weakness.     Coordination: Coordination normal.     Gait: Gait normal.      ED Treatments / Results  Labs (all labs ordered are listed, but only abnormal results are displayed) Labs Reviewed   COMPREHENSIVE METABOLIC PANEL - Abnormal; Notable for the following components:      Result Value   CO2 21 (*)    Glucose, Bld 105 (*)    BUN 7 (*)    All other components within normal limits  PROTIME-INR  APTT  CBC  DIFFERENTIAL  SEDIMENTATION RATE  I-STAT CREATININE, ED  CBG MONITORING, ED    EKG EKG Interpretation  Date/Time:  Monday March 06 2019 11:07:04 EDT Ventricular Rate:  58 PR Interval:  184 QRS Duration: 86 QT Interval:  414 QTC Calculation: 406 R Axis:   40 Text Interpretation:  Sinus bradycardia Minimal voltage criteria for LVH, may be normal variant Borderline ECG No previous ECGs available Confirmed by Darl Householder,  Fenton Malling ([id number]) on 03/06/2019 11:08:46 AM   Radiology Ct Head Wo Contrast  Result Date: 03/06/2019 CLINICAL DATA:  Slurred speech. Hypertension. Right lower extremity weakness EXAM: CT HEAD WITHOUT CONTRAST TECHNIQUE: Contiguous axial images were obtained from the base of the skull through the vertex without intravenous contrast. COMPARISON:  October 31, 2011 FINDINGS: Brain: The ventricles are normal in size and configuration. There is no intracranial mass, hemorrhage, extra-axial fluid collection, or midline shift. Brain parenchyma appears unremarkable. No acute infarct evident. Vascular: There is no appreciable hyperdense vessel. There is calcification in each carotid siphon region. Skull: The bony calvarium appears intact. Sinuses/Orbits: There is mucosal thickening in several ethmoid air cells. There is slight mucosal thickening in the posterior right maxillary antrum. Orbits appear symmetric bilaterally. Other: Mastoid air cells are clear. IMPRESSION: Brain parenchyma appears unremarkable.  No mass or hemorrhage. There are foci of arterial vascular calcification. There are foci of paranasal sinus disease. Electronically Signed   By: Lowella Grip III M.D.   On: 03/06/2019 12:26   Mr Brain Wo Contrast  Result Date: 03/06/2019 CLINICAL DATA:  Headache and  speech difficulty EXAM: MRI HEAD WITHOUT CONTRAST TECHNIQUE: Multiplanar, multiecho pulse sequences of the brain and surrounding structures were obtained without intravenous contrast. COMPARISON:  None. FINDINGS: BRAIN: There is no acute infarct, acute hemorrhage, hydrocephalus or extra-axial collection. The midline structures are normal. No midline shift or other mass effect. Multifocal white matter hyperintensity, most commonly due to chronic ischemic microangiopathy. The cerebral and cerebellar volume are age-appropriate. Susceptibility-sensitive sequences show no chronic microhemorrhage or superficial siderosis. VASCULAR: Major intracranial arterial and venous sinus flow voids are normal. SKULL AND UPPER CERVICAL SPINE: Calvarial bone marrow signal is normal. There is no skull base mass. Visualized upper cervical spine and soft tissues are normal. SINUSES/ORBITS: Mild bilateral maxillary mucosal thickening. No mastoid or middle ear effusion. The orbits are normal. IMPRESSION: Mild chronic small vessel disease. No acute intracranial abnormality. Electronically Signed   By: Ulyses Jarred M.D.   On: 03/06/2019 20:15    Procedures Procedures (including critical care time)  Medications Ordered in ED Medications  acetaminophen (TYLENOL) tablet 650 mg (650 mg Oral Given 03/06/19 1749)     Initial Impression / Assessment and Plan / ED Course  I have reviewed the triage vital signs and the nursing notes.  Pertinent labs & imaging results that were available during my care of the patient were reviewed by me and considered in my medical decision making (see chart for details).        Patient is a 63 year old male with history of hypertension who presents the emergency department complaining of right lower extremity weakness, headache, and expressive aphasia described as stuttering, intermittently over the past 3 days however is improving.  Initial evaluation the patient he was hemodynamically stable  and nontoxic-appearing.  Patient hypertensive, afebrile, not tachycardic, satting appropriately on room air.  Physical exam as detailed above which is remarkable for mild tenderness palpation over bilateral temples.  Neurologic examination is unremarkable as the patient has no cranial nerve deficits, 5 out of 5 strength in all 4 extremities, normal finger-to-nose, negative pronator drift, and no ataxia or gait disturbances on ambulation.  Prior to my evaluation the patient he had lab work and CT scan obtained.  These were reviewed by myself and used in medical decision-making.  No significant derangements noted.  CT head showing no acute intracranial abnormalities.  Patient is lower extremity weakness most likely secondary to peripheral etiology such as  lumbar radiculopathy, sacroiliitis, or muscle strain given the patient's description of symptoms and improvement with ambulation.  Given his headache with mild visual changes and tenderness over bilateral temporal regions ESR was obtained which was negative.  This leaves Giant cell arteritis is exceedingly unlikely. Patient given Tylenol for symptomatic relief with good results. Likely tension HA.   This leaves patient's expressive aphasia that he describes as intermittent stuttering.  The symptoms were discussed with neurology who recommended MRI to further evaluate and if negative patient safe for discharge home with neurology follow-up.  MRI shows mild chronic small vessel disease with no acute intracranial abnormalities.  These results were discussed with the patient and his wife who were encouraged with results.  Patient was moderately hypertensive upon arrival and states he has not been taking his medications over the past few weeks.  Some level of hypertensive urgency may be to blame for his intermittent stuttering however blood pressure came down to acceptable levels and no evidence of expressive aphasia were ever noted on my physical exam.  I  encouraged the patient to resume taking his antihypertensives as prescribed.  Patient given contact information for neurology follow-up and will be prescribed daily baby aspirin until outpatient neuro evaluation can be completed.  I also discussed concerning signs and symptoms that would necessitate return to the emergency department.  Patient and wife both voiced understanding of these instructions and had no further questions at this time.  Final Clinical Impressions(s) / ED Diagnoses   Final diagnoses:  Sacroiliitis [facility])  Dysarthria    ED Discharge Orders         Ordered    aspirin 81 MG chewable tablet  Daily     03/06/19 2044           Tommie Raymond, MD 03/07/19 0121    Merrily Pew, MD 03/07/19 1930

## 2019-03-06 NOTE — Progress Notes (Signed)
Justin Santos 63 y.o.   Chief Complaint  Patient presents with  . Speech Problem    X 1 week- stuttering  . Headache    X 2-3 days  . Depression    screening done score 13  . Anxiety    screening done score 16    HISTORY OF PRESENT ILLNESS: This is a 63 y.o. male with history of hypertension developed headache 1 week ago followed by the speech difficulty that got worse over the weekend.  Has difficulty getting the words out.  Has stuttering speech.  Also noticed mild weakness of his right leg and thinks his balance is off.  No other significant symptoms.  Denies head injury.  No previous history of stroke or MI.  HPI   Prior to Admission medications   Medication Sig Start Date End Date Taking? Authorizing Provider  amLODipine (NORVASC) 10 MG tablet Take 1 tablet (10 mg total) by mouth daily. 05/31/18   Tenna Delaine D, PA-C  atorvastatin (LIPITOR) 20 MG tablet Take 1 tablet (20 mg total) by mouth daily. Patient not taking: Reported on 06/13/2018 06/10/18   Tenna Delaine D, PA-C  hydrochlorothiazide (MICROZIDE) 12.5 MG capsule Take 1 capsule (12.5 mg total) by mouth daily. 05/31/18   Tenna Delaine D, PA-C  loratadine (CLARITIN) 10 MG tablet Take 10 mg by mouth daily.    [provider]  triamcinolone cream (KENALOG) 0.1 % Apply topically 3 (three) times daily. No more than 2 weeks. Patient not taking: Reported on 06/13/2018 08/14/13   Rikki Spearing P, DO    Allergies  Allergen Reactions  . Peanuts [Peanut Oil] Itching    Rash and mouth/throat itching    Patient Active Problem List   Diagnosis Date Noted  . Overweight 08/01/2013    Past Medical History:  Diagnosis Date  . Allergy   . Environmental allergies   . GERD (gastroesophageal reflux disease)   . Hypertension   . IBS (irritable bowel syndrome)     Past Surgical History:  Procedure Laterality Date  . COLECTOMY  2001  . UPPER GASTROINTESTINAL ENDOSCOPY      Social History   Socioeconomic History   . Marital status: Married    Spouse name: Not on file  . Number of children: 2  . Years of education: Not on file  . Highest education level: Not on file  Occupational History  . Not on file  Social Needs  . Financial resource strain: Not on file  . Food insecurity:    Worry: Never true    Inability: Never true  . Transportation needs:    Medical: No    Non-medical: No  Tobacco Use  . Smoking status: Former Smoker    Last attempt to quit: 04/26/1994    Years since quitting: 24.8  . Smokeless tobacco: Never Used  Substance and Sexual Activity  . Alcohol use: No  . Drug use: No  . Sexual activity: Yes    Partners: Female    Comment: with monogamous partner  Lifestyle  . Physical activity:    Days per week: 0 days    Minutes per session: 0 min  . Stress: Not on file  Relationships  . Social connections:    Talks on phone: More than three times a week    Gets together: Once a week    Attends religious service: More than 4 times per year    Active member of club or organization: Yes    Attends meetings of  clubs or organizations: More than 4 times per year    Relationship status: Married  . Intimate partner violence:    Fear of current or ex partner: No    Emotionally abused: No    Physically abused: No    Forced sexual activity: No  Other Topics Concern  . Not on file  Social History Narrative   Pt is from [place], which is where he currently lives with his wife. 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     Family History  Problem Relation Age of Onset  . Heart disease Father      Review of Systems  Constitutional: Negative.  Negative for chills and fever.  HENT: Negative.   Eyes: Negative.  Negative for blurred vision and double vision.  Cardiovascular: Negative.  Negative for chest pain, palpitations and leg swelling.  Gastrointestinal: Negative.  Negative for abdominal pain, diarrhea, nausea and vomiting.  Genitourinary: Negative.  Negative for dysuria and hematuria.   Musculoskeletal: Negative.   Skin: Negative.  Negative for rash.  Neurological: Positive for dizziness, speech change, focal weakness, weakness and headaches. Negative for sensory change, seizures and loss of consciousness.  All other systems reviewed and are negative.   Vitals:   03/06/19 1011 03/06/19 1030  BP: (!) 193/101 (!) 201/100  Pulse:    Temp:    SpO2:      Physical Exam Vitals signs reviewed.  Constitutional:      Appearance: He is well-developed.  HENT:     Head: Normocephalic and atraumatic.  Eyes:     Extraocular Movements: Extraocular movements intact.     Pupils: Pupils are equal, round, and reactive to light.  Neck:     Musculoskeletal: Normal range of motion and neck supple.  Cardiovascular:     Rate and Rhythm: Normal rate and regular rhythm.  Pulmonary:     Effort: Pulmonary effort is normal.     Breath sounds: Normal breath sounds.  Musculoskeletal: Normal range of motion.  Skin:    General: Skin is warm and dry.     Capillary Refill: Capillary refill takes less than 2 seconds.  Neurological:     Mental Status: He is alert.     GCS: GCS eye subscore is 4. GCS verbal subscore is 5. GCS motor subscore is 6.     Cranial Nerves: Dysarthria present. No cranial nerve deficit or facial asymmetry.     Sensory: No sensory deficit.     Motor: No weakness.     Coordination: Coordination normal.     Gait: Gait normal.     Deep Tendon Reflexes: Reflexes normal.  Psychiatric:        Mood and Affect: Mood normal.        Behavior: Behavior normal.      ASSESSMENT & PLAN: Mehmet was seen today for speech problem, headache, depression and anxiety.  Diagnoses and all orders for this visit:  Cerebrovascular accident (CVA), unspecified mechanism ([place])  Uncontrolled hypertension  Dysarthria  Emergency department now for further evaluation, treatment, and diagnostic imaging.    Agustina Caroli, MD [facility]

## 2019-03-06 NOTE — Patient Instructions (Addendum)
Go to emergency department for further evaluation now.   If you have lab work done today you will be contacted with your lab results within the next 2 weeks.  If you have not heard from [place] then please contact us. The fastest way to get your results is to register for My Chart.   IF you received an x-ray today, you will receive an invoice from [facility]. Please contact [facility] at [phone] with questions or concerns regarding your invoice.   IF you received labwork today, you will receive an invoice from [facility]. Please contact [facility] at [phone] with questions or concerns regarding your invoice.   Our billing staff will not be able to assist you with questions regarding bills from these companies.  You will be contacted with the lab results as soon as they are available. The fastest way to get your results is to activate your My Chart account. Instructions are located on the last page of this paperwork. If you have not heard from [place] regarding the results in 2 weeks, please contact this office.     Hypertension Hypertension, commonly called high blood pressure, is when the force of blood pumping through the arteries is too strong. The arteries are the blood vessels that carry blood from the heart throughout the body. Hypertension forces the heart to work harder to pump blood and may cause arteries to become narrow or stiff. Having untreated or uncontrolled hypertension can cause heart attacks, strokes, kidney disease, and other problems. A blood pressure reading consists of a higher number over a lower number. Ideally, your blood pressure should be below 120/80. The first ("top") number is called the systolic pressure. It is a measure of the pressure in your arteries as your heart beats. The second ("bottom") number is called the diastolic pressure. It is a measure of the pressure in your arteries as the heart relaxes. What are the causes? The cause of this  condition is not known. What increases the risk? Some risk factors for high blood pressure are under your control. Others are not. Factors you can change  Smoking.  Having type 2 diabetes mellitus, high cholesterol, or both.  Not getting enough exercise or physical activity.  Being overweight.  Having too much fat, sugar, calories, or salt (sodium) in your diet.  Drinking too much alcohol. Factors that are difficult or impossible to change  Having chronic kidney disease.  Having a family history of high blood pressure.  Age. Risk increases with age.  Race. You may be at higher risk if you are African-American.  Gender. Men are at higher risk than women before age 72. After age 49, women are at higher risk than men.  Having obstructive sleep apnea.  Stress. What are the signs or symptoms? Extremely high blood pressure (hypertensive crisis) may cause:  Headache.  Anxiety.  Shortness of breath.  Nosebleed.  Nausea and vomiting.  Severe chest pain.  Jerky movements you cannot control (seizures). How is this diagnosed? This condition is diagnosed by measuring your blood pressure while you are seated, with your arm resting on a surface. The cuff of the blood pressure monitor will be placed directly against the skin of your upper arm at the level of your heart. It should be measured at least twice using the same arm. Certain conditions can cause a difference in blood pressure between your right and left arms. Certain factors can cause blood pressure readings to be lower or higher than normal (elevated) for a  short period of time:  When your blood pressure is higher when you are in a health care provider's office than when you are at home, this is called white coat hypertension. Most people with this condition do not need medicines.  When your blood pressure is higher at home than when you are in a health care provider's office, this is called masked hypertension. Most  people with this condition may need medicines to control blood pressure. If you have a high blood pressure reading during one visit or you have normal blood pressure with other risk factors:  You may be asked to return on a different day to have your blood pressure checked again.  You may be asked to monitor your blood pressure at home for 1 week or longer. If you are diagnosed with hypertension, you may have other blood or imaging tests to help your health care provider understand your overall risk for other conditions. How is this treated? This condition is treated by making healthy lifestyle changes, such as eating healthy foods, exercising more, and reducing your alcohol intake. Your health care provider may prescribe medicine if lifestyle changes are not enough to get your blood pressure under control, and if:  Your systolic blood pressure is above 130.  Your diastolic blood pressure is above 80. Your personal target blood pressure may vary depending on your medical conditions, your age, and other factors. Follow these instructions at home: Eating and drinking   Eat a diet that is high in fiber and potassium, and low in sodium, added sugar, and fat. An example eating plan is called the DASH (Dietary Approaches to Stop Hypertension) diet. To eat this way: ? Eat plenty of fresh fruits and vegetables. Try to fill half of your plate at each meal with fruits and vegetables. ? Eat whole grains, such as whole wheat pasta, brown rice, or whole grain bread. Fill about one quarter of your plate with whole grains. ? Eat or drink low-fat dairy products, such as skim milk or low-fat yogurt. ? Avoid fatty cuts of meat, processed or cured meats, and poultry with skin. Fill about one quarter of your plate with lean proteins, such as fish, chicken without skin, beans, eggs, and tofu. ? Avoid premade and processed foods. These tend to be higher in sodium, added sugar, and fat.  Reduce your daily sodium  intake. Most people with hypertension should eat less than 1,500 mg of sodium a day.  Limit alcohol intake to no more than 1 drink a day for nonpregnant women and 2 drinks a day for men. One drink equals 12 oz of beer, 5 oz of wine, or 1 oz of hard liquor. Lifestyle   Work with your health care provider to maintain a healthy body weight or to lose weight. Ask what an ideal weight is for you.  Get at least 30 minutes of exercise that causes your heart to beat faster (aerobic exercise) most days of the week. Activities may include walking, swimming, or biking.  Include exercise to strengthen your muscles (resistance exercise), such as pilates or lifting weights, as part of your weekly exercise routine. Try to do these types of exercises for 30 minutes at least 3 days a week.  Do not use any products that contain nicotine or tobacco, such as cigarettes and e-cigarettes. If you need help quitting, ask your health care provider.  Monitor your blood pressure at home as told by your health care provider.  Keep all follow-up visits as told  by your health care provider. This is important. Medicines  Take over-the-counter and prescription medicines only as told by your health care provider. Follow directions carefully. Blood pressure medicines must be taken as prescribed.  Do not skip doses of blood pressure medicine. Doing this puts you at risk for problems and can make the medicine less effective.  Ask your health care provider about side effects or reactions to medicines that you should watch for. Contact a health care provider if:  You think you are having a reaction to a medicine you are taking.  You have headaches that keep coming back (recurring).  You feel dizzy.  You have swelling in your ankles.  You have trouble with your vision. Get help right away if:  You develop a severe headache or confusion.  You have unusual weakness or numbness.  You feel faint.  You have severe  pain in your chest or abdomen.  You vomit repeatedly.  You have trouble breathing. Summary  Hypertension is when the force of blood pumping through your arteries is too strong. If this condition is not controlled, it may put you at risk for serious complications.  Your personal target blood pressure may vary depending on your medical conditions, your age, and other factors. For most people, a normal blood pressure is less than 120/80.  Hypertension is treated with lifestyle changes, medicines, or a combination of both. Lifestyle changes include weight loss, eating a healthy, low-sodium diet, exercising more, and limiting alcohol. This information is not intended to replace advice given to you by your health care provider. Make sure you discuss any questions you have with your health care provider. Document Released: 12/14/2005 Document Revised: 11/11/2016 Document Reviewed: 11/11/2016 Elsevier Interactive Patient Education  2019 [employer].  Ischemic Stroke  An ischemic stroke is the sudden death of brain tissue. Blood carries oxygen to all areas of the body. This type of stroke happens when your blood does not flow to your brain like normal. Your brain cannot get the oxygen it needs. This is an emergency. It must be treated right away. Symptoms of a stroke usually happen all of a sudden. You may notice them when you wake up. They can include:  Weakness or loss of feeling in your face, arm, or leg. This often happens on one side of the body.  Trouble walking.  Trouble moving your arms or legs.  Loss of balance or coordination.  Feeling confused.  Trouble talking or understanding what people are saying.  Slurred speech.  Trouble seeing.  Seeing two of one object (double vision).  Feeling dizzy.  Feeling sick to your stomach (nauseous) and throwing up (vomiting).  A very bad headache for no reason. Get help as soon as any of these problems start. This is important. Some  treatments work better if they are given right away. These include:  Aspirin.  Medicines to control blood pressure.  A shot (injection) of medicine to break up the blood clot.  Treatments given in the blood vessel (artery) to take out the clot or break it up. Other treatments may include:  Oxygen.  Fluids given through an IV tube.  Medicines to thin out your blood.  Procedures to help your blood flow better. What increases the risk? Certain things may make you more likely to have a stroke. Some of these are things that you can change, such as:  Being very overweight (obesity).  Smoking.  Taking birth control pills.  Not being active.  Drinking too  much alcohol.  Using drugs. Other risk factors include:  High blood pressure.  High cholesterol.  Diabetes.  Heart disease.  Being [place] American, Native American, Hispanic, or [place] Native.  Being over age 77.  Family history of stroke.  Having had blood clots, stroke, or warning stroke (transient ischemic attack, TIA) in the past.  Sickle cell disease.  Being a woman with a history of high blood pressure in pregnancy (preeclampsia).  Migraine headache.  Sleep apnea.  Having an irregular heartbeat (atrial fibrillation).  Long-term (chronic) diseases that cause soreness and swelling (inflammation).  Disorders that affect how your blood clots. Follow these instructions at home: Medicines  Take over-the-counter and prescription medicines only as told by your doctor.  If you were told to take aspirin or another medicine to thin your blood, take it exactly as told by your doctor. ? Taking too much of the medicine can cause bleeding. ? If you do not take enough, it may not work as well.  Know the side effects of your medicines. If you are taking a blood thinner, make sure you: ? Hold pressure over any cuts for longer than usual. ? Tell your dentist and other doctors that you take this  medicine. ? Avoid activities that may cause damage or injury to your body. Eating and drinking  Follow instructions from your doctor about what you cannot eat or drink.  Eat healthy foods.  If you have trouble with swallowing, do these things to avoid choking: ? Take small bites when eating. ? Eat foods that are soft or pureed. Safety  Follow instructions from your health care team about physical activity.  Use a walker or cane as told by your doctor.  Keep your home safe so you do not fall. This may include: ? Having experts look at your home to make sure it is safe. ? Putting grab bars in the bedroom and bathroom. ? Using raised toilets. ? Putting a seat in the shower. General instructions  Do not use any tobacco products. ? Examples of these are cigarettes, chewing tobacco, and e-cigarettes. ? If you need help quitting, ask your doctor.  Limit how much alcohol you drink. This means no more than 1 drink a day for nonpregnant women and 2 drinks a day for men. One drink equals 12 oz of beer, 5 oz of wine, or 1 oz of hard liquor.  If you need help to stop using drugs or alcohol, ask your doctor to refer you to a program or specialist.  Stay active. Exercise as told by your doctor.  Keep all follow-up visits as told by your doctor. This is important. Get help right away if:   You have any signs of a stroke. "BE FAST" is an easy way to remember the main warning signs: ? B - Balance. Signs are dizziness, sudden trouble walking, or loss of balance. ? E - Eyes. Signs are trouble seeing or a change in how you see. ? F - Face. Signs are sudden weakness or loss of feeling of the face, or the face or eyelid drooping on one side. ? A - Arms. Signs are weakness or loss of feeling in an arm. This happens suddenly and usually on one side of the body. ? S - Speech. Signs are sudden trouble speaking, slurred speech, or trouble understanding what people say. ? T - Time. Time to call  emergency services. Write down what time symptoms started.  You have other signs of a stroke, such  as: ? A sudden, very bad headache with no known cause. ? Feeling sick to your stomach (nausea). ? Throwing up (vomiting). ? Jerky movements you cannot control (seizure). These symptoms may be an emergency. Do not wait to see if the symptoms will go away. Get medical help right away. Call your local emergency services (911 in the [place] Do not drive yourself to the hospital. Summary  An ischemic stroke is the sudden death of brain tissue.  Symptoms of a stroke usually happen all of a sudden. You may notice them when you wake up.  Get help if you have any warning signs of a stroke. This is important. Some treatments work better if they are given right away. This information is not intended to replace advice given to you by your health care provider. Make sure you discuss any questions you have with your health care provider. Document Released: 12/03/2011 Document Revised: 05/25/2018 Document Reviewed: 03/11/2016 Elsevier Interactive Patient Education  2019 [employer].

## 2019-03-10 ENCOUNTER — Encounter: Payer: Self-pay | Admitting: Neurology

## 2019-05-09 ENCOUNTER — Ambulatory Visit: Payer: 59 | Admitting: Emergency Medicine

## 2019-05-18 ENCOUNTER — Encounter: Payer: Self-pay | Admitting: Neurology

## 2019-05-19 ENCOUNTER — Ambulatory Visit: Payer: Self-pay | Admitting: Neurology

## 2019-07-29 ENCOUNTER — Other Ambulatory Visit: Payer: Self-pay | Admitting: Physician Assistant

## 2019-07-29 DIAGNOSIS — I10 Essential (primary) hypertension: Secondary | ICD-10-CM

## 2019-07-29 NOTE — Telephone Encounter (Signed)
Requested medication (s) are due for refill today: yes  Requested medication (s) are on the active medication list: yes  Last refill:  05/31/18  Future visit scheduled: no  Notes to clinic:  > 3 months overdue for OV   Requested Prescriptions  Pending Prescriptions Disp Refills   amLODipine (NORVASC) 10 MG tablet [Pharmacy Med Name: amLODIPine Besylate 10 MG Oral Tablet] 90 tablet 0    Sig: Take 1 tablet by mouth once daily     Cardiovascular:  Calcium Channel Blockers Failed - 07/29/2019  2:54 PM      Failed - Last BP in normal range    BP Readings from Last 1 Encounters:  03/06/19 (!) 159/90         Failed - Valid encounter within last 6 months    Recent Outpatient Visits          4 months ago Cerebrovascular accident (CVA), unspecified mechanism ([place])   [facility] [place], [name], MD   1 year ago Essential hypertension   [facility] [place], [name], PA-C   1 year ago Annual physical exam   [facility] [place], [name], PA-C   2 years ago Essential hypertension   [facility] [place], [name], PA-C   2 years ago Blood pressure elevated without history of HTN   [facility] [place], [name], PA-C              hydrochlorothiazide (MICROZIDE) 12.5 MG capsule [Pharmacy Med Name: hydroCHLOROthiazide 12.5 MG Oral Capsule] 90 capsule 0    Sig: Take 1 capsule by mouth once daily     Cardiovascular: Diuretics - Thiazide Failed - 07/29/2019  2:54 PM      Failed - Last BP in normal range    BP Readings from Last 1 Encounters:  03/06/19 (!) 159/90         Failed - Valid encounter within last 6 months    Recent Outpatient Visits          4 months ago Cerebrovascular accident (CVA), unspecified mechanism ([place])   [facility] [place], [name], MD   1 year ago Essential hypertension   [facility] [place], [name], PA-C   1 year ago Annual physical exam   [facility] [place], [name], PA-C   2 years ago Essential hypertension   [facility] [place], [name], PA-C   2 years ago Blood pressure elevated without history of HTN   [facility] [place], [name], PA-C             Passed - Ca in normal range and within 360 days    Calcium  Date Value Ref Range Status  03/06/2019 9.3 8.9 - 10.3 mg/dL Final         Passed - Cr in normal range and within 360 days    Creat  Date Value Ref Range Status  10/23/2016 0.99 0.70 - 1.25 mg/dL Final    Comment:      For patients > or = 64 years of age: The upper reference limit for Creatinine is approximately 13% higher for people identified as African-American.      Creatinine, Ser  Date Value Ref Range Status  03/06/2019 1.00 0.61 - 1.24 mg/dL Final         Passed - K in normal range and within 360 days    Potassium  Date Value Ref Range Status  03/06/2019 4.2 3.5 - 5.1 mmol/L Final         Passed - Na in normal range and within 360 days    Sodium  Date Value Ref Range Status  03/06/2019 137 135 - 145 mmol/L Final  05/31/2018 140 134 - 144 mmol/L Final

## 2019-08-04 ENCOUNTER — Telehealth: Payer: Self-pay | Admitting: Emergency Medicine

## 2019-08-04 NOTE — Telephone Encounter (Signed)
Pt is needing his bp medication refilled to last until his appt. I made him an appt for a TOC w/ [facility] on 08/17/2019. He uses the [facility] in [place], [place]

## 2019-08-07 ENCOUNTER — Other Ambulatory Visit: Payer: Self-pay

## 2019-08-07 DIAGNOSIS — I10 Essential (primary) hypertension: Secondary | ICD-10-CM

## 2019-08-07 MED ORDER — AMLODIPINE BESYLATE 10 MG PO TABS: 10.0000 mg | ORAL_TABLET | Freq: Every day | ORAL | 0 refills | Status: DC

## 2019-08-07 NOTE — Telephone Encounter (Signed)
Rx sent to pharmacy   

## 2019-08-17 ENCOUNTER — Encounter: Payer: 59 | Admitting: Emergency Medicine

## 2019-08-23 ENCOUNTER — Encounter: Payer: Self-pay | Admitting: Emergency Medicine

## 2019-08-23 ENCOUNTER — Other Ambulatory Visit: Payer: Self-pay

## 2019-08-23 ENCOUNTER — Ambulatory Visit: Payer: 59 | Admitting: Emergency Medicine

## 2019-08-23 VITALS — BP 158/76 | HR 68 | Temp 98.4°F | Resp 16 | Ht 71.0 in | Wt 216.2 lb

## 2019-08-23 DIAGNOSIS — Z7689 Persons encountering health services in other specified circumstances: Secondary | ICD-10-CM

## 2019-08-23 DIAGNOSIS — E785 Hyperlipidemia, unspecified: Secondary | ICD-10-CM

## 2019-08-23 DIAGNOSIS — Z23 Encounter for immunization: Secondary | ICD-10-CM

## 2019-08-23 DIAGNOSIS — I10 Essential (primary) hypertension: Secondary | ICD-10-CM

## 2019-08-23 DIAGNOSIS — Z8673 Personal history of transient ischemic attack (TIA), and cerebral infarction without residual deficits: Secondary | ICD-10-CM

## 2019-08-23 MED ORDER — ATORVASTATIN CALCIUM 20 MG PO TABS: 20.0000 mg | ORAL_TABLET | Freq: Every day | ORAL | 3 refills | Status: DC

## 2019-08-23 MED ORDER — AMLODIPINE BESYLATE 10 MG PO TABS: 10.0000 mg | ORAL_TABLET | Freq: Every day | ORAL | 3 refills | Status: DC

## 2019-08-23 MED ORDER — HYDROCHLOROTHIAZIDE 12.5 MG PO CAPS: 12.5000 mg | ORAL_CAPSULE | Freq: Every day | ORAL | 3 refills | Status: DC

## 2019-08-23 NOTE — Progress Notes (Signed)
Justin Santos 63 y.o.   Chief Complaint  Patient presents with  . Hypertension    follow up  . Medication Refill    Amlodipine,Atorvastatin and hctz  . Establish Care    HISTORY OF PRESENT ILLNESS: This is a 62 y.o. male with history of hypertension and CVA last March here to establish care with me. 1.  Hypertension: On amlodipine and hydrochlorothiazide.  Blood pressures at home 120-130 over 80s.  Doing well 2.  Dyslipidemia: On atorvastatin 20 mg daily. 3.  History of CVA: Did well with minimal deficits.  Takes 1 baby aspirin a day. Needs medication refills.  Has no complaints or medical concerns today.  HPI   Prior to Admission medications   Medication Sig Start Date End Date Taking? Authorizing Provider  acetaminophen (TYLENOL) 500 MG tablet Take 500-1,000 mg by mouth every 6 (six) hours as needed for mild pain or headache.    [provider]  amLODipine (NORVASC) 10 MG tablet Take 1 tablet (10 mg total) by mouth daily. 08/23/19 11/21/19  Horald Pollen, MD  atorvastatin (LIPITOR) 20 MG tablet Take 1 tablet (20 mg total) by mouth daily. 08/23/19   Horald Pollen, MD  cetirizine (ZYRTEC) 10 MG tablet Take 10 mg by mouth daily as needed for allergies or rhinitis.    [provider]  hydrochlorothiazide (MICROZIDE) 12.5 MG capsule Take 1 capsule (12.5 mg total) by mouth daily. 08/23/19   Horald Pollen, MD  triamcinolone cream (KENALOG) 0.1 % Apply topically 3 (three) times daily. No more than 2 weeks. Patient not taking: Reported on 03/06/2019 08/14/13   Rikki Spearing P, DO    Allergies  Allergen Reactions  . Peanuts [Peanut Oil] Shortness Of Breath, Itching and Rash    Mouth and throat itch and this causes wheezing  . Azithromycin Hives    Patient Active Problem List   Diagnosis Date Noted  . Uncontrolled hypertension 03/06/2019  . Cerebrovascular accident (CVA) ([place]) 03/06/2019  . Dysarthria 03/06/2019  . Overweight 08/01/2013    Past  Medical History:  Diagnosis Date  . Allergy   . Environmental allergies   . GERD (gastroesophageal reflux disease)   . Hypertension   . IBS (irritable bowel syndrome)     Past Surgical History:  Procedure Laterality Date  . COLECTOMY  2001  . UPPER GASTROINTESTINAL ENDOSCOPY      Social History   Socioeconomic History  . Marital status: Married    Spouse name: Not on file  . Number of children: 2  . Years of education: Not on file  . Highest education level: Not on file  Occupational History  . Not on file  Social Needs  . Financial resource strain: Not on file  . Food insecurity    Worry: Never true    Inability: Never true  . Transportation needs    Medical: No    Non-medical: No  Tobacco Use  . Smoking status: Former Smoker    Quit date: 04/26/1994    Years since quitting: 25.3  . Smokeless tobacco: Never Used  Substance and Sexual Activity  . Alcohol use: No  . Drug use: No  . Sexual activity: Yes    Partners: Female    Comment: with monogamous partner  Lifestyle  . Physical activity    Days per week: 0 days    Minutes per session: 0 min  . Stress: Not on file  Relationships  . Social Herbalist on phone:  More than three times a week    Gets together: Once a week    Attends religious service: More than 4 times per year    Active member of club or organization: Yes    Attends meetings of clubs or organizations: More than 4 times per year    Relationship status: Married  . Intimate partner violence    Fear of current or ex partner: No    Emotionally abused: No    Physically abused: No    Forced sexual activity: No  Other Topics Concern  . Not on file  Social History Narrative   Pt is from [place], which is where he currently lives with his wife. 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     Family History  Problem Relation Age of Onset  . Heart disease Father      Review of Systems  Constitutional: Negative.  Negative for chills and fever.   HENT: Negative.  Negative for congestion and sore throat.   Respiratory: Negative.  Negative for cough and shortness of breath.   Cardiovascular: Negative.  Negative for chest pain.  Gastrointestinal: Negative.  Negative for abdominal pain, diarrhea, nausea and vomiting.  Genitourinary: Negative.   Musculoskeletal: Negative.  Negative for back pain, myalgias and neck pain.  Skin: Negative.  Negative for rash.  Neurological: Negative for dizziness and headaches.  All other systems reviewed and are negative.  Vitals:   08/23/19 1537  BP: (!) 158/76  Pulse: 68  Resp: 16  Temp: 98.4 F (36.9 C)  SpO2: 96%     Physical Exam Vitals signs reviewed.  Constitutional:      Appearance: Normal appearance.  HENT:     Head: Normocephalic.  Eyes:     Extraocular Movements: Extraocular movements intact.     Conjunctiva/sclera: Conjunctivae normal.     Pupils: Pupils are equal, round, and reactive to light.  Neck:     Musculoskeletal: Normal range of motion and neck supple.  Cardiovascular:     Rate and Rhythm: Normal rate and regular rhythm.     Pulses: Normal pulses.     Heart sounds: Normal heart sounds.  Pulmonary:     Effort: Pulmonary effort is normal.     Breath sounds: Normal breath sounds.  Abdominal:     Palpations: Abdomen is soft.     Tenderness: There is no abdominal tenderness.  Musculoskeletal: Normal range of motion.  Skin:    General: Skin is warm and dry.  Neurological:     General: No focal deficit present.     Mental Status: He is alert and oriented to person, place, and time.  Psychiatric:        Mood and Affect: Mood normal.        Behavior: Behavior normal.     A total of 25 minutes was spent in the room with the patient, greater than 50% of which was in counseling/coordination of care regarding hypertension and cardiovascular risk associated with it, treatment and management, diet and nutrition, medication side effects, prognosis, and need for follow-up.   ASSESSMENT & PLAN: Daundre was seen today for hypertension, medication refill and establish care.  Diagnoses and all orders for this visit:  Essential hypertension -     amLODipine (NORVASC) 10 MG tablet; Take 1 tablet (10 mg total) by mouth daily. -     hydrochlorothiazide (MICROZIDE) 12.5 MG capsule; Take 1 capsule (12.5 mg total) by mouth daily.  Need for prophylactic vaccination and inoculation against influenza -  Flu Vaccine QUAD 36+ mos IM  Dyslipidemia -     atorvastatin (LIPITOR) 20 MG tablet; Take 1 tablet (20 mg total) by mouth daily.  Encounter to establish care  History of CVA (cerebrovascular accident)    Patient Instructions       If you have lab work done today you will be contacted with your lab results within the next 2 weeks.  If you have not heard from [place] then please contact us. The fastest way to get your results is to register for My Chart.   IF you received an x-ray today, you will receive an invoice from [facility]. Please contact [facility] at [phone] with questions or concerns regarding your invoice.   IF you received labwork today, you will receive an invoice from [facility]. Please contact [facility] at [phone] with questions or concerns regarding your invoice.   Our billing staff will not be able to assist you with questions regarding bills from these companies.  You will be contacted with the lab results as soon as they are available. The fastest way to get your results is to activate your My Chart account. Instructions are located on the last page of this paperwork. If you have not heard from [place] regarding the results in 2 weeks, please contact this office.     Hypertension, Adult High blood pressure (hypertension) is when the force of blood pumping through the arteries is too strong. The arteries are the blood vessels that carry blood from the heart throughout the body. Hypertension forces the heart to work harder  to pump blood and may cause arteries to become narrow or stiff. Untreated or uncontrolled hypertension can cause a heart attack, heart failure, a stroke, kidney disease, and other problems. A blood pressure reading consists of a higher number over a lower number. Ideally, your blood pressure should be below 120/80. The first ("top") number is called the systolic pressure. It is a measure of the pressure in your arteries as your heart beats. The second ("bottom") number is called the diastolic pressure. It is a measure of the pressure in your arteries as the heart relaxes. What are the causes? The exact cause of this condition is not known. There are some conditions that result in or are related to high blood pressure. What increases the risk? Some risk factors for high blood pressure are under your control. The following factors may make you more likely to develop this condition:  Smoking.  Having type 2 diabetes mellitus, high cholesterol, or both.  Not getting enough exercise or physical activity.  Being overweight.  Having too much fat, sugar, calories, or salt (sodium) in your diet.  Drinking too much alcohol. Some risk factors for high blood pressure may be difficult or impossible to change. Some of these factors include:  Having chronic kidney disease.  Having a family history of high blood pressure.  Age. Risk increases with age.  Race. You may be at higher risk if you are African American.  Gender. Men are at higher risk than women before age 37. After age 80, women are at higher risk than men.  Having obstructive sleep apnea.  Stress. What are the signs or symptoms? High blood pressure may not cause symptoms. Very high blood pressure (hypertensive crisis) may cause:  Headache.  Anxiety.  Shortness of breath.  Nosebleed.  Nausea and vomiting.  Vision changes.  Severe chest pain.  Seizures. How is this diagnosed? This condition is diagnosed by measuring your  blood  pressure while you are seated, with your arm resting on a flat surface, your legs uncrossed, and your feet flat on the floor. The cuff of the blood pressure monitor will be placed directly against the skin of your upper arm at the level of your heart. It should be measured at least twice using the same arm. Certain conditions can cause a difference in blood pressure between your right and left arms. Certain factors can cause blood pressure readings to be lower or higher than normal for a short period of time:  When your blood pressure is higher when you are in a health care provider's office than when you are at home, this is called white coat hypertension. Most people with this condition do not need medicines.  When your blood pressure is higher at home than when you are in a health care provider's office, this is called masked hypertension. Most people with this condition may need medicines to control blood pressure. If you have a high blood pressure reading during one visit or you have normal blood pressure with other risk factors, you may be asked to:  Return on a different day to have your blood pressure checked again.  Monitor your blood pressure at home for 1 week or longer. If you are diagnosed with hypertension, you may have other blood or imaging tests to help your health care provider understand your overall risk for other conditions. How is this treated? This condition is treated by making healthy lifestyle changes, such as eating healthy foods, exercising more, and reducing your alcohol intake. Your health care provider may prescribe medicine if lifestyle changes are not enough to get your blood pressure under control, and if:  Your systolic blood pressure is above 130.  Your diastolic blood pressure is above 80. Your personal target blood pressure may vary depending on your medical conditions, your age, and other factors. Follow these instructions at home: Eating and drinking    Eat a diet that is high in fiber and potassium, and low in sodium, added sugar, and fat. An example eating plan is called the DASH (Dietary Approaches to Stop Hypertension) diet. To eat this way: ? Eat plenty of fresh fruits and vegetables. Try to fill one half of your plate at each meal with fruits and vegetables. ? Eat whole grains, such as whole-wheat pasta, brown rice, or whole-grain bread. Fill about one fourth of your plate with whole grains. ? Eat or drink low-fat dairy products, such as skim milk or low-fat yogurt. ? Avoid fatty cuts of meat, processed or cured meats, and poultry with skin. Fill about one fourth of your plate with lean proteins, such as fish, chicken without skin, beans, eggs, or tofu. ? Avoid pre-made and processed foods. These tend to be higher in sodium, added sugar, and fat.  Reduce your daily sodium intake. Most people with hypertension should eat less than 1,500 mg of sodium a day.  Do not drink alcohol if: ? Your health care provider tells you not to drink. ? You are pregnant, may be pregnant, or are planning to become pregnant.  If you drink alcohol: ? Limit how much you use to:  0-1 drink a day for women.  0-2 drinks a day for men. ? Be aware of how much alcohol is in your drink. In the [place] one drink equals one 12 oz bottle of beer (355 mL), one 5 oz glass of wine (148 mL), or one 1 oz glass of hard liquor (44 mL).  Lifestyle   Work with your health care provider to maintain a healthy body weight or to lose weight. Ask what an ideal weight is for you.  Get at least 30 minutes of exercise most days of the week. Activities may include walking, swimming, or biking.  Include exercise to strengthen your muscles (resistance exercise), such as Pilates or lifting weights, as part of your weekly exercise routine. Try to do these types of exercises for 30 minutes at least 3 days a week.  Do not use any products that contain nicotine or tobacco, such as  cigarettes, e-cigarettes, and chewing tobacco. If you need help quitting, ask your health care provider.  Monitor your blood pressure at home as told by your health care provider.  Keep all follow-up visits as told by your health care provider. This is important. Medicines  Take over-the-counter and prescription medicines only as told by your health care provider. Follow directions carefully. Blood pressure medicines must be taken as prescribed.  Do not skip doses of blood pressure medicine. Doing this puts you at risk for problems and can make the medicine less effective.  Ask your health care provider about side effects or reactions to medicines that you should watch for. Contact a health care provider if you:  Think you are having a reaction to a medicine you are taking.  Have headaches that keep coming back (recurring).  Feel dizzy.  Have swelling in your ankles.  Have trouble with your vision. Get help right away if you:  Develop a severe headache or confusion.  Have unusual weakness or numbness.  Feel faint.  Have severe pain in your chest or abdomen.  Vomit repeatedly.  Have trouble breathing. Summary  Hypertension is when the force of blood pumping through your arteries is too strong. If this condition is not controlled, it may put you at risk for serious complications.  Your personal target blood pressure may vary depending on your medical conditions, your age, and other factors. For most people, a normal blood pressure is less than 120/80.  Hypertension is treated with lifestyle changes, medicines, or a combination of both. Lifestyle changes include losing weight, eating a healthy, low-sodium diet, exercising more, and limiting alcohol. This information is not intended to replace advice given to you by your health care provider. Make sure you discuss any questions you have with your health care provider. Document Released: 12/14/2005 Document Revised: 08/24/2018  Document Reviewed: 08/24/2018 Elsevier Patient Education  2020 [employer]      Agustina Caroli, MD [facility]

## 2019-08-23 NOTE — Patient Instructions (Addendum)
   If you have lab work done today you will be contacted with your lab results within the next 2 weeks.  If you have not heard from us then please contact us. The fastest way to get your results is to register for My Chart.   IF you received an x-ray today, you will receive an invoice from [facility]. Please contact [facility] at [phone] with questions or concerns regarding your invoice.   IF you received labwork today, you will receive an invoice from [facility]. Please contact [facility] at [phone] with questions or concerns regarding your invoice.   Our billing staff will not be able to assist you with questions regarding bills from these companies.  You will be contacted with the lab results as soon as they are available. The fastest way to get your results is to activate your My Chart account. Instructions are located on the last page of this paperwork. If you have not heard from us regarding the results in 2 weeks, please contact this office.     Hypertension, Adult High blood pressure (hypertension) is when the force of blood pumping through the arteries is too strong. The arteries are the blood vessels that carry blood from the heart throughout the body. Hypertension forces the heart to work harder to pump blood and may cause arteries to become narrow or stiff. Untreated or uncontrolled hypertension can cause a heart attack, heart failure, a stroke, kidney disease, and other problems. A blood pressure reading consists of a higher number over a lower number. Ideally, your blood pressure should be below 120/80. The first ("top") number is called the systolic pressure. It is a measure of the pressure in your arteries as your heart beats. The second ("bottom") number is called the diastolic pressure. It is a measure of the pressure in your arteries as the heart relaxes. What are the causes? The exact cause of this condition is not known. There are some conditions  that result in or are related to high blood pressure. What increases the risk? Some risk factors for high blood pressure are under your control. The following factors may make you more likely to develop this condition:  Smoking.  Having type 2 diabetes mellitus, high cholesterol, or both.  Not getting enough exercise or physical activity.  Being overweight.  Having too much fat, sugar, calories, or salt (sodium) in your diet.  Drinking too much alcohol. Some risk factors for high blood pressure may be difficult or impossible to change. Some of these factors include:  Having chronic kidney disease.  Having a family history of high blood pressure.  Age. Risk increases with age.  Race. You may be at higher risk if you are African American.  Gender. Men are at higher risk than women before age 45. After age 65, women are at higher risk than men.  Having obstructive sleep apnea.  Stress. What are the signs or symptoms? High blood pressure may not cause symptoms. Very high blood pressure (hypertensive crisis) may cause:  Headache.  Anxiety.  Shortness of breath.  Nosebleed.  Nausea and vomiting.  Vision changes.  Severe chest pain.  Seizures. How is this diagnosed? This condition is diagnosed by measuring your blood pressure while you are seated, with your arm resting on a flat surface, your legs uncrossed, and your feet flat on the floor. The cuff of the blood pressure monitor will be placed directly against the skin of your upper arm at the level of your heart.   It should be measured at least twice using the same arm. Certain conditions can cause a difference in blood pressure between your right and left arms. Certain factors can cause blood pressure readings to be lower or higher than normal for a short period of time:  When your blood pressure is higher when you are in a health care provider's office than when you are at home, this is called white coat hypertension.  Most people with this condition do not need medicines.  When your blood pressure is higher at home than when you are in a health care provider's office, this is called masked hypertension. Most people with this condition may need medicines to control blood pressure. If you have a high blood pressure reading during one visit or you have normal blood pressure with other risk factors, you may be asked to:  Return on a different day to have your blood pressure checked again.  Monitor your blood pressure at home for 1 week or longer. If you are diagnosed with hypertension, you may have other blood or imaging tests to help your health care provider understand your overall risk for other conditions. How is this treated? This condition is treated by making healthy lifestyle changes, such as eating healthy foods, exercising more, and reducing your alcohol intake. Your health care provider may prescribe medicine if lifestyle changes are not enough to get your blood pressure under control, and if:  Your systolic blood pressure is above 130.  Your diastolic blood pressure is above 80. Your personal target blood pressure may vary depending on your medical conditions, your age, and other factors. Follow these instructions at home: Eating and drinking   Eat a diet that is high in fiber and potassium, and low in sodium, added sugar, and fat. An example eating plan is called the DASH (Dietary Approaches to Stop Hypertension) diet. To eat this way: ? Eat plenty of fresh fruits and vegetables. Try to fill one half of your plate at each meal with fruits and vegetables. ? Eat whole grains, such as whole-wheat pasta, brown rice, or whole-grain bread. Fill about one fourth of your plate with whole grains. ? Eat or drink low-fat dairy products, such as skim milk or low-fat yogurt. ? Avoid fatty cuts of meat, processed or cured meats, and poultry with skin. Fill about one fourth of your plate with lean proteins, such  as fish, chicken without skin, beans, eggs, or tofu. ? Avoid pre-made and processed foods. These tend to be higher in sodium, added sugar, and fat.  Reduce your daily sodium intake. Most people with hypertension should eat less than 1,500 mg of sodium a day.  Do not drink alcohol if: ? Your health care provider tells you not to drink. ? You are pregnant, may be pregnant, or are planning to become pregnant.  If you drink alcohol: ? Limit how much you use to:  0-1 drink a day for women.  0-2 drinks a day for men. ? Be aware of how much alcohol is in your drink. In the [place] one drink equals one 12 oz bottle of beer (355 mL), one 5 oz glass of wine (148 mL), or one 1 oz glass of hard liquor (44 mL). Lifestyle   Work with your health care provider to maintain a healthy body weight or to lose weight. Ask what an ideal weight is for you.  Get at least 30 minutes of exercise most days of the week. Activities may include walking, swimming, or   biking.  Include exercise to strengthen your muscles (resistance exercise), such as Pilates or lifting weights, as part of your weekly exercise routine. Try to do these types of exercises for 30 minutes at least 3 days a week.  Do not use any products that contain nicotine or tobacco, such as cigarettes, e-cigarettes, and chewing tobacco. If you need help quitting, ask your health care provider.  Monitor your blood pressure at home as told by your health care provider.  Keep all follow-up visits as told by your health care provider. This is important. Medicines  Take over-the-counter and prescription medicines only as told by your health care provider. Follow directions carefully. Blood pressure medicines must be taken as prescribed.  Do not skip doses of blood pressure medicine. Doing this puts you at risk for problems and can make the medicine less effective.  Ask your health care provider about side effects or reactions to medicines that you  should watch for. Contact a health care provider if you:  Think you are having a reaction to a medicine you are taking.  Have headaches that keep coming back (recurring).  Feel dizzy.  Have swelling in your ankles.  Have trouble with your vision. Get help right away if you:  Develop a severe headache or confusion.  Have unusual weakness or numbness.  Feel faint.  Have severe pain in your chest or abdomen.  Vomit repeatedly.  Have trouble breathing. Summary  Hypertension is when the force of blood pumping through your arteries is too strong. If this condition is not controlled, it may put you at risk for serious complications.  Your personal target blood pressure may vary depending on your medical conditions, your age, and other factors. For most people, a normal blood pressure is less than 120/80.  Hypertension is treated with lifestyle changes, medicines, or a combination of both. Lifestyle changes include losing weight, eating a healthy, low-sodium diet, exercising more, and limiting alcohol. This information is not intended to replace advice given to you by your health care provider. Make sure you discuss any questions you have with your health care provider. Document Released: 12/14/2005 Document Revised: 08/24/2018 Document Reviewed: 08/24/2018 [employer] Patient Education  2020 [employer]  

## 2019-08-23 NOTE — Progress Notes (Signed)

## 2019-11-21 ENCOUNTER — Other Ambulatory Visit: Payer: Self-pay

## 2019-11-21 ENCOUNTER — Encounter: Payer: Self-pay | Admitting: Emergency Medicine

## 2019-11-21 ENCOUNTER — Ambulatory Visit (INDEPENDENT_AMBULATORY_CARE_PROVIDER_SITE_OTHER): Payer: 59 | Admitting: Emergency Medicine

## 2019-11-21 VITALS — BP 121/65 | HR 71 | Temp 98.3°F | Resp 16 | Ht 69.0 in | Wt 215.2 lb

## 2019-11-21 DIAGNOSIS — Z8673 Personal history of transient ischemic attack (TIA), and cerebral infarction without residual deficits: Secondary | ICD-10-CM | POA: Diagnosis not present

## 2019-11-21 DIAGNOSIS — E785 Hyperlipidemia, unspecified: Secondary | ICD-10-CM | POA: Diagnosis not present

## 2019-11-21 DIAGNOSIS — I10 Essential (primary) hypertension: Secondary | ICD-10-CM | POA: Diagnosis not present

## 2019-11-21 NOTE — Assessment & Plan Note (Signed)
Well-controlled hypertension.  Continue amlodipine 10 mg and hydrochlorothiazide 12.5 mg daily.  Continue atorvastatin 20 mg daily.  Follow-up in 6 months.

## 2019-11-21 NOTE — Progress Notes (Signed)
Justin Santos 63 y.o.   Chief Complaint  Patient presents with  . Hypertension    follow up 3 months    HISTORY OF PRESENT ILLNESS: This is a 63 y.o. male with history of hypertension here for follow-up.  Doing well.  Has no complaints. BP Readings from Last 3 Encounters:  11/21/19 121/65  08/23/19 (!) 158/76  03/06/19 (!) 159/90   Lab Results  Component Value Date   CHOL 229 (H) 05/31/2018   HDL 59 05/31/2018   LDLCALC 148 (H) 05/31/2018   TRIG 108 05/31/2018   CHOLHDL 3.9 05/31/2018   Lab Results  Component Value Date   CREATININE 1.00 03/06/2019   BUN 7 (L) 03/06/2019   NA 137 03/06/2019   K 4.2 03/06/2019   CL 107 03/06/2019   CO2 21 (L) 03/06/2019    HPI   Prior to Admission medications   Medication Sig Start Date End Date Taking? Authorizing Provider  acetaminophen (TYLENOL) 500 MG tablet Take 500-1,000 mg by mouth every 6 (six) hours as needed for mild pain or headache.   Yes [provider]  amLODipine (NORVASC) 10 MG tablet Take 1 tablet (10 mg total) by mouth daily. 08/23/19 11/21/19 Yes Keona Bilyeu, [name], MD  atorvastatin (LIPITOR) 20 MG tablet Take 1 tablet (20 mg total) by mouth daily. 08/23/19  Yes Vivi Piccirilli, [name], MD  cetirizine (ZYRTEC) 10 MG tablet Take 10 mg by mouth daily as needed for allergies or rhinitis.   Yes [provider]  hydrochlorothiazide (MICROZIDE) 12.5 MG capsule Take 1 capsule (12.5 mg total) by mouth daily. 08/23/19  Yes Haiden Rawlinson, [name], MD  triamcinolone cream (KENALOG) 0.1 % Apply topically 3 (three) times daily. No more than 2 weeks. 08/14/13   Le, Thao P, DO    Allergies  Allergen Reactions  . Peanuts [Peanut Oil] Shortness Of Breath, Itching and Rash    Mouth and throat itch and this causes wheezing  . Azithromycin Hives    Patient Active Problem List   Diagnosis Date Noted  . Dyslipidemia 08/23/2019  . History of CVA (cerebrovascular accident) 08/23/2019  . Essential hypertension  03/06/2019  . Overweight 08/01/2013    Past Medical History:  Diagnosis Date  . Allergy   . Environmental allergies   . GERD (gastroesophageal reflux disease)   . Hypertension   . IBS (irritable bowel syndrome)     Past Surgical History:  Procedure Laterality Date  . COLECTOMY  2001  . UPPER GASTROINTESTINAL ENDOSCOPY      Social History   Socioeconomic History  . Marital status: Married    Spouse name: Not on file  . Number of children: 2  . Years of education: Not on file  . Highest education level: Not on file  Occupational History  . Not on file  Social Needs  . Financial resource strain: Not on file  . Food insecurity    Worry: Never true    Inability: Never true  . Transportation needs    Medical: No    Non-medical: No  Tobacco Use  . Smoking status: Former Smoker    Quit date: 04/26/1994    Years since quitting: 25.5  . Smokeless tobacco: Never Used  Substance and Sexual Activity  . Alcohol use: No  . Drug use: No  . Sexual activity: Yes    Partners: Female    Comment: with monogamous partner  Lifestyle  . Physical activity    Days per week: 0 days  Minutes per session: 0 min  . Stress: Not on file  Relationships  . Social connections    Talks on phone: More than three times a week    Gets together: Once a week    Attends religious service: More than 4 times per year    Active member of club or organization: Yes    Attends meetings of clubs or organizations: More than 4 times per year    Relationship status: Married  . Intimate partner violence    Fear of current or ex partner: No    Emotionally abused: No    Physically abused: No    Forced sexual activity: No  Other Topics Concern  . Not on file  Social History Narrative   Pt is from [place], which is where he currently lives with his wife. 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     Family History  Problem Relation Age of Onset  . Heart disease Father      Review of Systems  Constitutional:  Negative.  Negative for chills and fever.  HENT: Negative.  Negative for congestion and sore throat.   Eyes: Negative for blurred vision and double vision.  Respiratory: Negative.  Negative for cough and shortness of breath.   Cardiovascular: Negative.  Negative for chest pain, palpitations and leg swelling.  Gastrointestinal: Negative.  Negative for abdominal pain, blood in stool, diarrhea, nausea and vomiting.  Genitourinary: Negative.  Negative for dysuria and hematuria.  Musculoskeletal: Negative.  Negative for myalgias and neck pain.  Skin: Negative.  Negative for rash.  Neurological: Negative.  Negative for dizziness, focal weakness, loss of consciousness, weakness and headaches.  All other systems reviewed and are negative.   Vitals:   11/21/19 1609  BP: 121/65  Pulse: 71  Resp: 16  Temp: 98.3 F (36.8 C)  SpO2: 97%    Physical Exam Vitals signs reviewed.  Constitutional:      Appearance: Normal appearance.  HENT:     Head: Normocephalic.  Eyes:     Extraocular Movements: Extraocular movements intact.     Conjunctiva/sclera: Conjunctivae normal.     Pupils: Pupils are equal, round, and reactive to light.  Neck:     Musculoskeletal: Normal range of motion and neck supple.  Cardiovascular:     Rate and Rhythm: Normal rate and regular rhythm.     Pulses: Normal pulses.     Heart sounds: Normal heart sounds.  Pulmonary:     Effort: Pulmonary effort is normal.     Breath sounds: Normal breath sounds.  Musculoskeletal: Normal range of motion.     Right lower leg: No edema.     Left lower leg: No edema.  Skin:    General: Skin is warm and dry.     Capillary Refill: Capillary refill takes less than 2 seconds.  Neurological:     General: No focal deficit present.     Mental Status: He is alert and oriented to person, place, and time.  Psychiatric:        Mood and Affect: Mood normal.        Behavior: Behavior normal.    A total of 25 minutes was spent in the room  with the patient, greater than 50% of which was in counseling/coordination of care regarding hypertension, treatment, medication side effects, diet and nutrition, cardiovascular risks associated with it, prognosis, review of most recent blood work, and need for follow-up.   ASSESSMENT & PLAN: Essential hypertension Well-controlled hypertension.  Continue amlodipine  10 mg and hydrochlorothiazide 12.5 mg daily.  Continue atorvastatin 20 mg daily.  Follow-up in 6 months.  Shed was seen today for hypertension.  Diagnoses and all orders for this visit:  Essential hypertension  Dyslipidemia  History of CVA (cerebrovascular accident)    Patient Instructions       If you have lab work done today you will be contacted with your lab results within the next 2 weeks.  If you have not heard from [place] then please contact us. The fastest way to get your results is to register for My Chart.   IF you received an x-ray today, you will receive an invoice from [facility]. Please contact [facility] at [phone] with questions or concerns regarding your invoice.   IF you received labwork today, you will receive an invoice from [place]. Please contact [facility] at [phone] with questions or concerns regarding your invoice.   Our billing staff will not be able to assist you with questions regarding bills from these companies.  You will be contacted with the lab results as soon as they are available. The fastest way to get your results is to activate your My Chart account. Instructions are located on the last page of this paperwork. If you have not heard from [place] regarding the results in 2 weeks, please contact this office.     Hypertension, Adult High blood pressure (hypertension) is when the force of blood pumping through the arteries is too strong. The arteries are the blood vessels that carry blood from the heart throughout the body. Hypertension forces the heart to work  harder to pump blood and may cause arteries to become narrow or stiff. Untreated or uncontrolled hypertension can cause a heart attack, heart failure, a stroke, kidney disease, and other problems. A blood pressure reading consists of a higher number over a lower number. Ideally, your blood pressure should be below 120/80. The first ("top") number is called the systolic pressure. It is a measure of the pressure in your arteries as your heart beats. The second ("bottom") number is called the diastolic pressure. It is a measure of the pressure in your arteries as the heart relaxes. What are the causes? The exact cause of this condition is not known. There are some conditions that result in or are related to high blood pressure. What increases the risk? Some risk factors for high blood pressure are under your control. The following factors may make you more likely to develop this condition:  Smoking.  Having type 2 diabetes mellitus, high cholesterol, or both.  Not getting enough exercise or physical activity.  Being overweight.  Having too much fat, sugar, calories, or salt (sodium) in your diet.  Drinking too much alcohol. Some risk factors for high blood pressure may be difficult or impossible to change. Some of these factors include:  Having chronic kidney disease.  Having a family history of high blood pressure.  Age. Risk increases with age.  Race. You may be at higher risk if you are African American.  Gender. Men are at higher risk than women before age 2. After age 71, women are at higher risk than men.  Having obstructive sleep apnea.  Stress. What are the signs or symptoms? High blood pressure may not cause symptoms. Very high blood pressure (hypertensive crisis) may cause:  Headache.  Anxiety.  Shortness of breath.  Nosebleed.  Nausea and vomiting.  Vision changes.  Severe chest pain.  Seizures. How is this diagnosed? This condition is  diagnosed by  measuring your blood pressure while you are seated, with your arm resting on a flat surface, your legs uncrossed, and your feet flat on the floor. The cuff of the blood pressure monitor will be placed directly against the skin of your upper arm at the level of your heart. It should be measured at least twice using the same arm. Certain conditions can cause a difference in blood pressure between your right and left arms. Certain factors can cause blood pressure readings to be lower or higher than normal for a short period of time:  When your blood pressure is higher when you are in a health care provider's office than when you are at home, this is called white coat hypertension. Most people with this condition do not need medicines.  When your blood pressure is higher at home than when you are in a health care provider's office, this is called masked hypertension. Most people with this condition may need medicines to control blood pressure. If you have a high blood pressure reading during one visit or you have normal blood pressure with other risk factors, you may be asked to:  Return on a different day to have your blood pressure checked again.  Monitor your blood pressure at home for 1 week or longer. If you are diagnosed with hypertension, you may have other blood or imaging tests to help your health care provider understand your overall risk for other conditions. How is this treated? This condition is treated by making healthy lifestyle changes, such as eating healthy foods, exercising more, and reducing your alcohol intake. Your health care provider may prescribe medicine if lifestyle changes are not enough to get your blood pressure under control, and if:  Your systolic blood pressure is above 130.  Your diastolic blood pressure is above 80. Your personal target blood pressure may vary depending on your medical conditions, your age, and other factors. Follow these instructions at home:  Eating and drinking   Eat a diet that is high in fiber and potassium, and low in sodium, added sugar, and fat. An example eating plan is called the DASH (Dietary Approaches to Stop Hypertension) diet. To eat this way: ? Eat plenty of fresh fruits and vegetables. Try to fill one half of your plate at each meal with fruits and vegetables. ? Eat whole grains, such as whole-wheat pasta, brown rice, or whole-grain bread. Fill about one fourth of your plate with whole grains. ? Eat or drink low-fat dairy products, such as skim milk or low-fat yogurt. ? Avoid fatty cuts of meat, processed or cured meats, and poultry with skin. Fill about one fourth of your plate with lean proteins, such as fish, chicken without skin, beans, eggs, or tofu. ? Avoid pre-made and processed foods. These tend to be higher in sodium, added sugar, and fat.  Reduce your daily sodium intake. Most people with hypertension should eat less than 1,500 mg of sodium a day.  Do not drink alcohol if: ? Your health care provider tells you not to drink. ? You are pregnant, may be pregnant, or are planning to become pregnant.  If you drink alcohol: ? Limit how much you use to:  0-1 drink a day for women.  0-2 drinks a day for men. ? Be aware of how much alcohol is in your drink. In the [place] one drink equals one 12 oz bottle of beer (355 mL), one 5 oz glass of wine (148 mL), or one 1 oz  glass of hard liquor (44 mL). Lifestyle   Work with your health care provider to maintain a healthy body weight or to lose weight. Ask what an ideal weight is for you.  Get at least 30 minutes of exercise most days of the week. Activities may include walking, swimming, or biking.  Include exercise to strengthen your muscles (resistance exercise), such as Pilates or lifting weights, as part of your weekly exercise routine. Try to do these types of exercises for 30 minutes at least 3 days a week.  Do not use any products that contain nicotine or  tobacco, such as cigarettes, e-cigarettes, and chewing tobacco. If you need help quitting, ask your health care provider.  Monitor your blood pressure at home as told by your health care provider.  Keep all follow-up visits as told by your health care provider. This is important. Medicines  Take over-the-counter and prescription medicines only as told by your health care provider. Follow directions carefully. Blood pressure medicines must be taken as prescribed.  Do not skip doses of blood pressure medicine. Doing this puts you at risk for problems and can make the medicine less effective.  Ask your health care provider about side effects or reactions to medicines that you should watch for. Contact a health care provider if you:  Think you are having a reaction to a medicine you are taking.  Have headaches that keep coming back (recurring).  Feel dizzy.  Have swelling in your ankles.  Have trouble with your vision. Get help right away if you:  Develop a severe headache or confusion.  Have unusual weakness or numbness.  Feel faint.  Have severe pain in your chest or abdomen.  Vomit repeatedly.  Have trouble breathing. Summary  Hypertension is when the force of blood pumping through your arteries is too strong. If this condition is not controlled, it may put you at risk for serious complications.  Your personal target blood pressure may vary depending on your medical conditions, your age, and other factors. For most people, a normal blood pressure is less than 120/80.  Hypertension is treated with lifestyle changes, medicines, or a combination of both. Lifestyle changes include losing weight, eating a healthy, low-sodium diet, exercising more, and limiting alcohol. This information is not intended to replace advice given to you by your health care provider. Make sure you discuss any questions you have with your health care provider. Document Released: 12/14/2005 Document  Revised: 08/24/2018 Document Reviewed: 08/24/2018 Elsevier Patient Education  2020 [employer]      Agustina Caroli, MD [facility]

## 2019-11-21 NOTE — Patient Instructions (Addendum)
   If you have lab work done today you will be contacted with your lab results within the next 2 weeks.  If you have not heard from us then please contact us. The fastest way to get your results is to register for My Chart.   IF you received an x-ray today, you will receive an invoice from [facility]. Please contact  Radiology at [phone] with questions or concerns regarding your invoice.   IF you received labwork today, you will receive an invoice from [facility]. Please contact [facility] at [phone] with questions or concerns regarding your invoice.   Our billing staff will not be able to assist you with questions regarding bills from these companies.  You will be contacted with the lab results as soon as they are available. The fastest way to get your results is to activate your My Chart account. Instructions are located on the last page of this paperwork. If you have not heard from us regarding the results in 2 weeks, please contact this office.     Hypertension, Adult High blood pressure (hypertension) is when the force of blood pumping through the arteries is too strong. The arteries are the blood vessels that carry blood from the heart throughout the body. Hypertension forces the heart to work harder to pump blood and may cause arteries to become narrow or stiff. Untreated or uncontrolled hypertension can cause a heart attack, heart failure, a stroke, kidney disease, and other problems. A blood pressure reading consists of a higher number over a lower number. Ideally, your blood pressure should be below 120/80. The first ("top") number is called the systolic pressure. It is a measure of the pressure in your arteries as your heart beats. The second ("bottom") number is called the diastolic pressure. It is a measure of the pressure in your arteries as the heart relaxes. What are the causes? The exact cause of this condition is not known. There are some conditions  that result in or are related to high blood pressure. What increases the risk? Some risk factors for high blood pressure are under your control. The following factors may make you more likely to develop this condition:  Smoking.  Having type 2 diabetes mellitus, high cholesterol, or both.  Not getting enough exercise or physical activity.  Being overweight.  Having too much fat, sugar, calories, or salt (sodium) in your diet.  Drinking too much alcohol. Some risk factors for high blood pressure may be difficult or impossible to change. Some of these factors include:  Having chronic kidney disease.  Having a family history of high blood pressure.  Age. Risk increases with age.  Race. You may be at higher risk if you are African American.  Gender. Men are at higher risk than women before age 45. After age 65, women are at higher risk than men.  Having obstructive sleep apnea.  Stress. What are the signs or symptoms? High blood pressure may not cause symptoms. Very high blood pressure (hypertensive crisis) may cause:  Headache.  Anxiety.  Shortness of breath.  Nosebleed.  Nausea and vomiting.  Vision changes.  Severe chest pain.  Seizures. How is this diagnosed? This condition is diagnosed by measuring your blood pressure while you are seated, with your arm resting on a flat surface, your legs uncrossed, and your feet flat on the floor. The cuff of the blood pressure monitor will be placed directly against the skin of your upper arm at the level of your heart.   It should be measured at least twice using the same arm. Certain conditions can cause a difference in blood pressure between your right and left arms. Certain factors can cause blood pressure readings to be lower or higher than normal for a short period of time:  When your blood pressure is higher when you are in a health care provider's office than when you are at home, this is called white coat hypertension.  Most people with this condition do not need medicines.  When your blood pressure is higher at home than when you are in a health care provider's office, this is called masked hypertension. Most people with this condition may need medicines to control blood pressure. If you have a high blood pressure reading during one visit or you have normal blood pressure with other risk factors, you may be asked to:  Return on a different day to have your blood pressure checked again.  Monitor your blood pressure at home for 1 week or longer. If you are diagnosed with hypertension, you may have other blood or imaging tests to help your health care provider understand your overall risk for other conditions. How is this treated? This condition is treated by making healthy lifestyle changes, such as eating healthy foods, exercising more, and reducing your alcohol intake. Your health care provider may prescribe medicine if lifestyle changes are not enough to get your blood pressure under control, and if:  Your systolic blood pressure is above 130.  Your diastolic blood pressure is above 80. Your personal target blood pressure may vary depending on your medical conditions, your age, and other factors. Follow these instructions at home: Eating and drinking   Eat a diet that is high in fiber and potassium, and low in sodium, added sugar, and fat. An example eating plan is called the DASH (Dietary Approaches to Stop Hypertension) diet. To eat this way: ? Eat plenty of fresh fruits and vegetables. Try to fill one half of your plate at each meal with fruits and vegetables. ? Eat whole grains, such as whole-wheat pasta, brown rice, or whole-grain bread. Fill about one fourth of your plate with whole grains. ? Eat or drink low-fat dairy products, such as skim milk or low-fat yogurt. ? Avoid fatty cuts of meat, processed or cured meats, and poultry with skin. Fill about one fourth of your plate with lean proteins, such  as fish, chicken without skin, beans, eggs, or tofu. ? Avoid pre-made and processed foods. These tend to be higher in sodium, added sugar, and fat.  Reduce your daily sodium intake. Most people with hypertension should eat less than 1,500 mg of sodium a day.  Do not drink alcohol if: ? Your health care provider tells you not to drink. ? You are pregnant, may be pregnant, or are planning to become pregnant.  If you drink alcohol: ? Limit how much you use to:  0-1 drink a day for women.  0-2 drinks a day for men. ? Be aware of how much alcohol is in your drink. In the [place] one drink equals one 12 oz bottle of beer (355 mL), one 5 oz glass of wine (148 mL), or one 1 oz glass of hard liquor (44 mL). Lifestyle   Work with your health care provider to maintain a healthy body weight or to lose weight. Ask what an ideal weight is for you.  Get at least 30 minutes of exercise most days of the week. Activities may include walking, swimming, or   biking.  Include exercise to strengthen your muscles (resistance exercise), such as Pilates or lifting weights, as part of your weekly exercise routine. Try to do these types of exercises for 30 minutes at least 3 days a week.  Do not use any products that contain nicotine or tobacco, such as cigarettes, e-cigarettes, and chewing tobacco. If you need help quitting, ask your health care provider.  Monitor your blood pressure at home as told by your health care provider.  Keep all follow-up visits as told by your health care provider. This is important. Medicines  Take over-the-counter and prescription medicines only as told by your health care provider. Follow directions carefully. Blood pressure medicines must be taken as prescribed.  Do not skip doses of blood pressure medicine. Doing this puts you at risk for problems and can make the medicine less effective.  Ask your health care provider about side effects or reactions to medicines that you  should watch for. Contact a health care provider if you:  Think you are having a reaction to a medicine you are taking.  Have headaches that keep coming back (recurring).  Feel dizzy.  Have swelling in your ankles.  Have trouble with your vision. Get help right away if you:  Develop a severe headache or confusion.  Have unusual weakness or numbness.  Feel faint.  Have severe pain in your chest or abdomen.  Vomit repeatedly.  Have trouble breathing. Summary  Hypertension is when the force of blood pumping through your arteries is too strong. If this condition is not controlled, it may put you at risk for serious complications.  Your personal target blood pressure may vary depending on your medical conditions, your age, and other factors. For most people, a normal blood pressure is less than 120/80.  Hypertension is treated with lifestyle changes, medicines, or a combination of both. Lifestyle changes include losing weight, eating a healthy, low-sodium diet, exercising more, and limiting alcohol. This information is not intended to replace advice given to you by your health care provider. Make sure you discuss any questions you have with your health care provider. Document Released: 12/14/2005 Document Revised: 08/24/2018 Document Reviewed: 08/24/2018 Elsevier Patient Education  2020 Elsevier Inc.  

## 2020-03-02 ENCOUNTER — Ambulatory Visit: Payer: Self-pay | Attending: Internal Medicine

## 2020-03-02 DIAGNOSIS — Z23 Encounter for immunization: Secondary | ICD-10-CM | POA: Insufficient documentation

## 2020-03-02 NOTE — Progress Notes (Signed)
   Covid-19 Vaccination Clinic  Name:  Justin Santos    MRN: [id number] DOB: 15-Apr-1956  03/02/2020  Justin Santos was observed post Covid-19 immunization for 15 minutes without incident. He was provided with Vaccine Information Sheet and instruction to access the V-Safe system.   Justin Santos was instructed to call 911 with any severe reactions post vaccine: Marland Kitchen Difficulty breathing  . Swelling of face and throat  . A fast heartbeat  . A bad rash all over body  . Dizziness and weakness   Immunizations Administered    Name Date Dose VIS Date Route   Pfizer COVID-19 Vaccine 03/02/2020 10:36 AM 0.3 mL 12/08/2019 Intramuscular   Manufacturer: [place]   Lot: [id number]   [place]: [id number]

## 2020-03-23 ENCOUNTER — Ambulatory Visit: Payer: Self-pay | Attending: Internal Medicine

## 2020-03-23 DIAGNOSIS — Z23 Encounter for immunization: Secondary | ICD-10-CM

## 2020-03-23 NOTE — Progress Notes (Signed)
   Covid-19 Vaccination Clinic  Name:  Justin Santos    MRN: [id number] DOB: 1956-07-06  03/23/2020  [name] was observed post Covid-19 immunization for 15 minutes without incident. He was provided with Vaccine Information Sheet and instruction to access the V-Safe system.   [name] was instructed to call 911 with any severe reactions post vaccine: Marland Kitchen Difficulty breathing  . Swelling of face and throat  . A fast heartbeat  . A bad rash all over body  . Dizziness and weakness   Immunizations Administered    Name Date Dose VIS Date Route   Pfizer COVID-19 Vaccine 03/23/2020 10:05 AM 0.3 mL 12/08/2019 Intramuscular   Manufacturer: [place]   Lot: [id number]   [place]: [id number]

## 2020-05-20 ENCOUNTER — Ambulatory Visit: Payer: 59 | Admitting: Emergency Medicine

## 2020-05-21 ENCOUNTER — Encounter: Payer: Self-pay | Admitting: Emergency Medicine

## 2020-05-22 ENCOUNTER — Ambulatory Visit: Payer: 59 | Admitting: Emergency Medicine

## 2020-05-22 ENCOUNTER — Other Ambulatory Visit: Payer: Self-pay

## 2020-05-22 ENCOUNTER — Ambulatory Visit (INDEPENDENT_AMBULATORY_CARE_PROVIDER_SITE_OTHER): Payer: 59

## 2020-05-22 ENCOUNTER — Encounter: Payer: Self-pay | Admitting: Emergency Medicine

## 2020-05-22 VITALS — BP 122/60 | HR 81 | Temp 97.7°F | Ht 69.0 in | Wt 220.8 lb

## 2020-05-22 DIAGNOSIS — I10 Essential (primary) hypertension: Secondary | ICD-10-CM | POA: Diagnosis not present

## 2020-05-22 DIAGNOSIS — M79672 Pain in left foot: Secondary | ICD-10-CM

## 2020-05-22 DIAGNOSIS — E785 Hyperlipidemia, unspecified: Secondary | ICD-10-CM | POA: Diagnosis not present

## 2020-05-22 IMAGING — DX DG FOOT COMPLETE 3+V*L*
3 series · 3 of 3 positions shown · non-contrast
Comparison: None.

CLINICAL DATA: Foot pain

EXAM:
LEFT FOOT - COMPLETE 3+ VIEW

[foot ap]
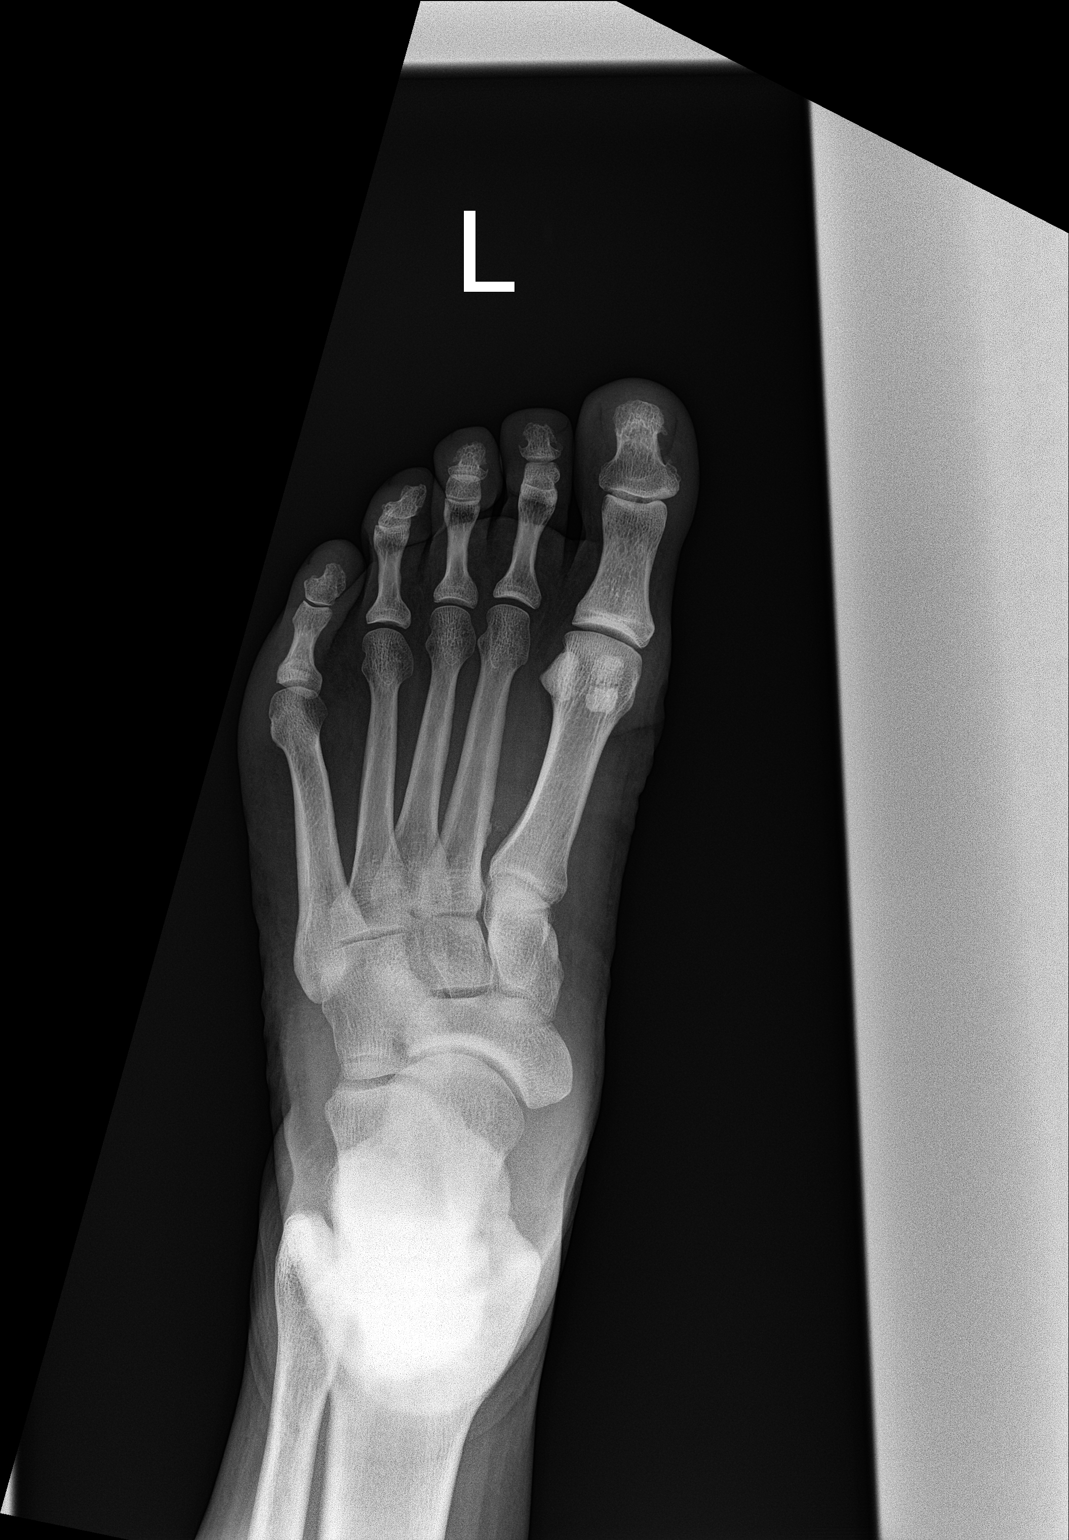

[foot obl]
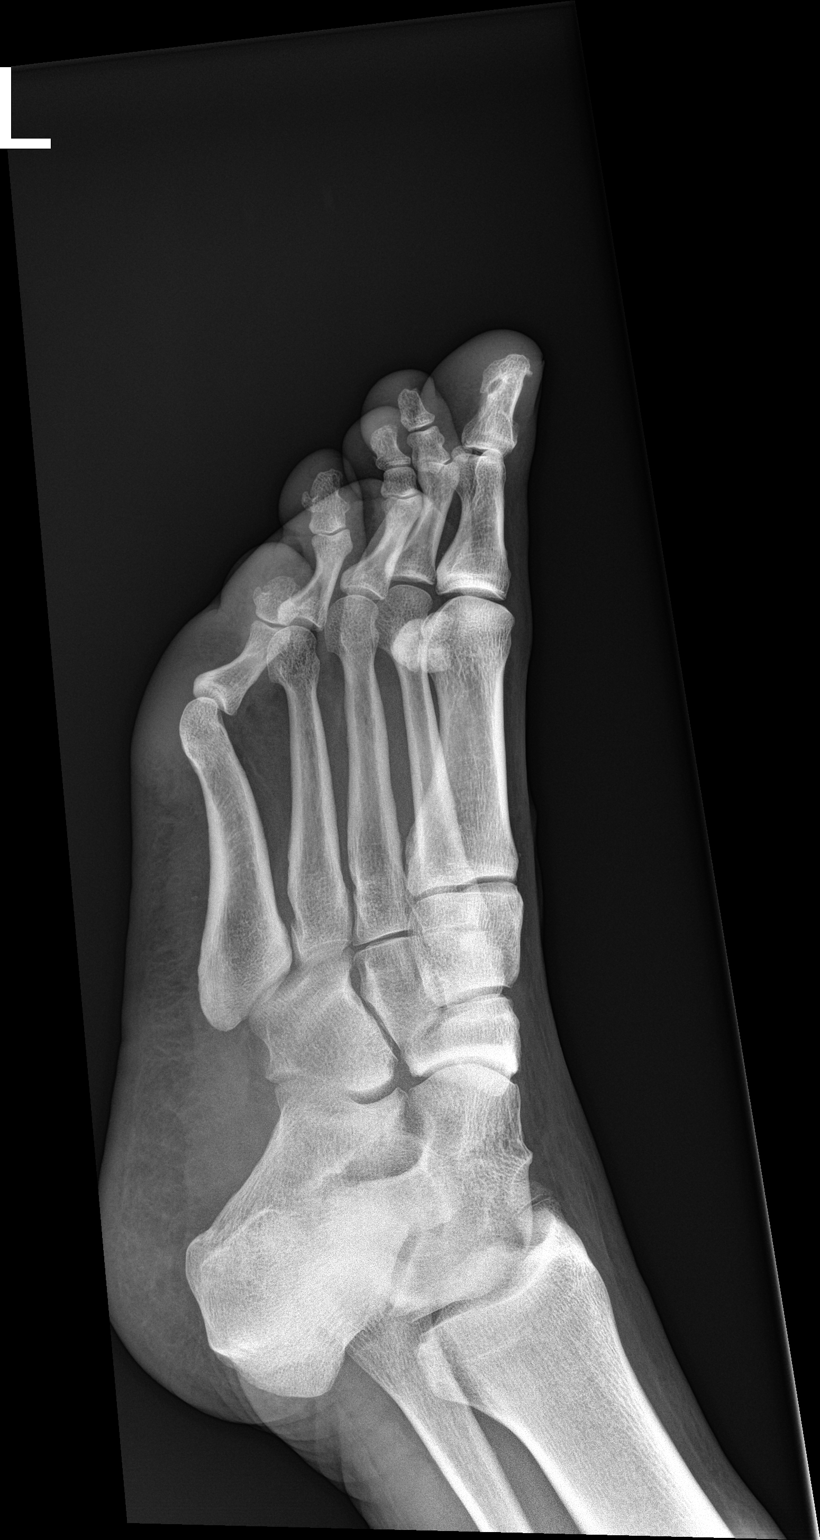

[foot lat]
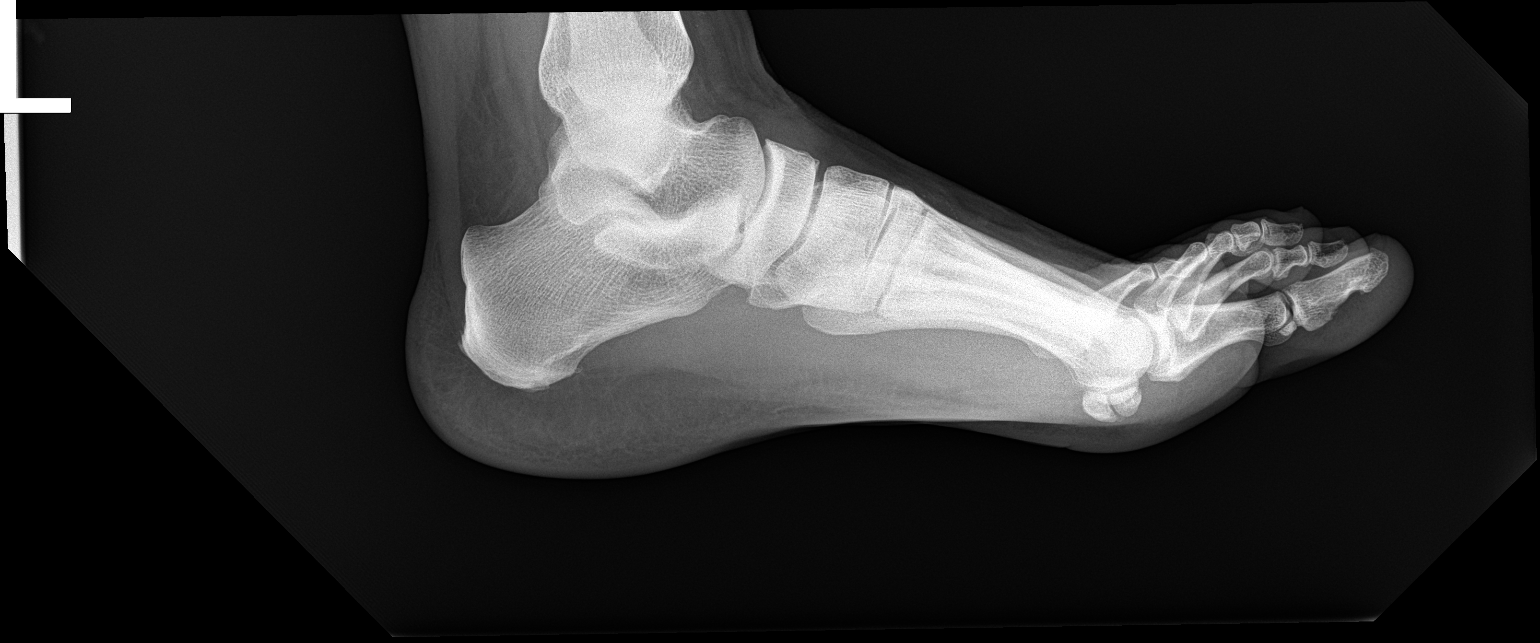

[3 of 3 positions shown; findings below may reference images not displayed]

FINDINGS: There is no evidence of fracture or dislocation. There is no
evidence of arthropathy or other focal bone abnormality. Soft
tissues are unremarkable.
IMPRESSION: Negative.

## 2020-05-22 MED ORDER — MELOXICAM 15 MG PO TABS: 15.0000 mg | ORAL_TABLET | Freq: Every day | ORAL | 0 refills | Status: AC

## 2020-05-22 NOTE — Patient Instructions (Addendum)
If you have lab work done today you will be contacted with your lab results within the next 2 weeks.  If you have not heard from [place] then please contact us. The fastest way to get your results is to register for My Chart.   IF you received an x-ray today, you will receive an invoice from [facility]. Please contact [facility] at [phone] with questions or concerns regarding your invoice.   IF you received labwork today, you will receive an invoice from [facility]. Please contact [facility] at [phone] with questions or concerns regarding your invoice.   Our billing staff will not be able to assist you with questions regarding bills from these companies.  You will be contacted with the lab results as soon as they are available. The fastest way to get your results is to activate your My Chart account. Instructions are located on the last page of this paperwork. If you have not heard from [place] regarding the results in 2 weeks, please contact this office.      Foot Pain Many things can cause foot pain. Some common causes are:  An injury.  A sprain.  Arthritis.  Blisters.  Bunions. Follow these instructions at home: Managing pain, stiffness, and swelling If directed, put ice on the painful area:  Put ice in a plastic bag.  Place a towel between your skin and the bag.  Leave the ice on for 20 minutes, 2-3 times a day.  Activity  Do not stand or walk for long periods.  Return to your normal activities as told by your health care provider. Ask your health care provider what activities are safe for you.  Do stretches to relieve foot pain and stiffness as told by your health care provider.  Do not lift anything that is heavier than 10 lb (4.5 kg), or the limit that you are told, until your health care provider says that it is safe. Lifting a lot of weight can put added pressure on your feet. Lifestyle  Wear comfortable, supportive shoes that fit you  well. Do not wear high heels.  Keep your feet clean and dry. General instructions  Take over-the-counter and prescription medicines only as told by your health care provider.  Rub your foot gently.  Pay attention to any changes in your symptoms.  Keep all follow-up visits as told by your health care provider. This is important. Contact a health care provider if:  Your pain does not get better after a few days of self-care.  Your pain gets worse.  You cannot stand on your foot. Get help right away if:  Your foot is numb or tingling.  Your foot or toes are swollen.  Your foot or toes turn white or blue.  You have warmth and redness along your foot. Summary  Common causes of foot pain are injury, sprain, arthritis, blisters or bunions.  Ice, medicines, and comfortable shoes may help foot pain.  Contact your health care provider if your pain does not get better after a few days of self-care. This information is not intended to replace advice given to you by your health care provider. Make sure you discuss any questions you have with your health care provider. Document Revised: 09/29/2018 Document Reviewed: 09/29/2018 Elsevier Patient Education  [place].  Hypertension, Adult High blood pressure (hypertension) is when the force of blood pumping through the arteries is too strong. The arteries are the blood vessels that carry blood from the heart  throughout the body. Hypertension forces the heart to work harder to pump blood and may cause arteries to become narrow or stiff. Untreated or uncontrolled hypertension can cause a heart attack, heart failure, a stroke, kidney disease, and other problems. A blood pressure reading consists of a higher number over a lower number. Ideally, your blood pressure should be below 120/80. The first ("top") number is called the systolic pressure. It is a measure of the pressure in your arteries as your heart beats. The second ("bottom")  number is called the diastolic pressure. It is a measure of the pressure in your arteries as the heart relaxes. What are the causes? The exact cause of this condition is not known. There are some conditions that result in or are related to high blood pressure. What increases the risk? Some risk factors for high blood pressure are under your control. The following factors may make you more likely to develop this condition:  Smoking.  Having type 2 diabetes mellitus, high cholesterol, or both.  Not getting enough exercise or physical activity.  Being overweight.  Having too much fat, sugar, calories, or salt (sodium) in your diet.  Drinking too much alcohol. Some risk factors for high blood pressure may be difficult or impossible to change. Some of these factors include:  Having chronic kidney disease.  Having a family history of high blood pressure.  Age. Risk increases with age.  Race. You may be at higher risk if you are African American.  Gender. Men are at higher risk than women before age 78. After age 71, women are at higher risk than men.  Having obstructive sleep apnea.  Stress. What are the signs or symptoms? High blood pressure may not cause symptoms. Very high blood pressure (hypertensive crisis) may cause:  Headache.  Anxiety.  Shortness of breath.  Nosebleed.  Nausea and vomiting.  Vision changes.  Severe chest pain.  Seizures. How is this diagnosed? This condition is diagnosed by measuring your blood pressure while you are seated, with your arm resting on a flat surface, your legs uncrossed, and your feet flat on the floor. The cuff of the blood pressure monitor will be placed directly against the skin of your upper arm at the level of your heart. It should be measured at least twice using the same arm. Certain conditions can cause a difference in blood pressure between your right and left arms. Certain factors can cause blood pressure readings to be  lower or higher than normal for a short period of time:  When your blood pressure is higher when you are in a health care provider's office than when you are at home, this is called white coat hypertension. Most people with this condition do not need medicines.  When your blood pressure is higher at home than when you are in a health care provider's office, this is called masked hypertension. Most people with this condition may need medicines to control blood pressure. If you have a high blood pressure reading during one visit or you have normal blood pressure with other risk factors, you may be asked to:  Return on a different day to have your blood pressure checked again.  Monitor your blood pressure at home for 1 week or longer. If you are diagnosed with hypertension, you may have other blood or imaging tests to help your health care provider understand your overall risk for other conditions. How is this treated? This condition is treated by making healthy lifestyle changes, such as  eating healthy foods, exercising more, and reducing your alcohol intake. Your health care provider may prescribe medicine if lifestyle changes are not enough to get your blood pressure under control, and if:  Your systolic blood pressure is above 130.  Your diastolic blood pressure is above 80. Your personal target blood pressure may vary depending on your medical conditions, your age, and other factors. Follow these instructions at home: Eating and drinking   Eat a diet that is high in fiber and potassium, and low in sodium, added sugar, and fat. An example eating plan is called the DASH (Dietary Approaches to Stop Hypertension) diet. To eat this way: ? Eat plenty of fresh fruits and vegetables. Try to fill one half of your plate at each meal with fruits and vegetables. ? Eat whole grains, such as whole-wheat pasta, brown rice, or whole-grain bread. Fill about one fourth of your plate with whole grains. ? Eat  or drink low-fat dairy products, such as skim milk or low-fat yogurt. ? Avoid fatty cuts of meat, processed or cured meats, and poultry with skin. Fill about one fourth of your plate with lean proteins, such as fish, chicken without skin, beans, eggs, or tofu. ? Avoid pre-made and processed foods. These tend to be higher in sodium, added sugar, and fat.  Reduce your daily sodium intake. Most people with hypertension should eat less than 1,500 mg of sodium a day.  Do not drink alcohol if: ? Your health care provider tells you not to drink. ? You are pregnant, may be pregnant, or are planning to become pregnant.  If you drink alcohol: ? Limit how much you use to:  0-1 drink a day for women.  0-2 drinks a day for men. ? Be aware of how much alcohol is in your drink. In the [place] one drink equals one 12 oz bottle of beer (355 mL), one 5 oz glass of wine (148 mL), or one 1 oz glass of hard liquor (44 mL). Lifestyle   Work with your health care provider to maintain a healthy body weight or to lose weight. Ask what an ideal weight is for you.  Get at least 30 minutes of exercise most days of the week. Activities may include walking, swimming, or biking.  Include exercise to strengthen your muscles (resistance exercise), such as Pilates or lifting weights, as part of your weekly exercise routine. Try to do these types of exercises for 30 minutes at least 3 days a week.  Do not use any products that contain nicotine or tobacco, such as cigarettes, e-cigarettes, and chewing tobacco. If you need help quitting, ask your health care provider.  Monitor your blood pressure at home as told by your health care provider.  Keep all follow-up visits as told by your health care provider. This is important. Medicines  Take over-the-counter and prescription medicines only as told by your health care provider. Follow directions carefully. Blood pressure medicines must be taken as prescribed.  Do not skip  doses of blood pressure medicine. Doing this puts you at risk for problems and can make the medicine less effective.  Ask your health care provider about side effects or reactions to medicines that you should watch for. Contact a health care provider if you:  Think you are having a reaction to a medicine you are taking.  Have headaches that keep coming back (recurring).  Feel dizzy.  Have swelling in your ankles.  Have trouble with your vision. Get help right away if  you:  Develop a severe headache or confusion.  Have unusual weakness or numbness.  Feel faint.  Have severe pain in your chest or abdomen.  Vomit repeatedly.  Have trouble breathing. Summary  Hypertension is when the force of blood pumping through your arteries is too strong. If this condition is not controlled, it may put you at risk for serious complications.  Your personal target blood pressure may vary depending on your medical conditions, your age, and other factors. For most people, a normal blood pressure is less than 120/80.  Hypertension is treated with lifestyle changes, medicines, or a combination of both. Lifestyle changes include losing weight, eating a healthy, low-sodium diet, exercising more, and limiting alcohol. This information is not intended to replace advice given to you by your health care provider. Make sure you discuss any questions you have with your health care provider. Document Revised: 08/24/2018 Document Reviewed: 08/24/2018 Elsevier Patient Education  2020 [employer].

## 2020-05-22 NOTE — Progress Notes (Signed)
Justin Santos 64 y.o.   Chief Complaint  Patient presents with  . Medical Management of Chronic Issues    6 f/u   . right foot swelling    2 months   . Hypertension    40 m f/u    HISTORY OF PRESENT ILLNESS: This is a 64 y.o. male with history of hypertension and dyslipidemia here for follow-up.  Presently taking amlodipine 10 mg and hydrochlorothiazide 12.5 mg daily.  History of dyslipidemia on atorvastatin 20 mg daily. BP Readings from Last 3 Encounters:  05/22/20 122/60  11/21/19 121/65  08/23/19 (!) 158/76  Also complaining of left foot pain for the past 2 months.  Denies injury.  No history of gout. Fully vaccinated against Covid. Lab Results  Component Value Date   CHOL 229 (H) 05/31/2018   HDL 59 05/31/2018   LDLCALC 148 (H) 05/31/2018   TRIG 108 05/31/2018   CHOLHDL 3.9 05/31/2018     HPI   Prior to Admission medications   Medication Sig Start Date End Date Taking? Authorizing Provider  acetaminophen (TYLENOL) 500 MG tablet Take 500-1,000 mg by mouth every 6 (six) hours as needed for mild pain or headache.   Yes [provider]  amLODipine (NORVASC) 10 MG tablet Take 1 tablet (10 mg total) by mouth daily. 08/23/19 05/22/20 Yes SagardiaInes Bloomer, MD  aspirin EC 81 MG tablet Take 81 mg by mouth daily.   Yes [provider]  atorvastatin (LIPITOR) 20 MG tablet Take 1 tablet (20 mg total) by mouth daily. 08/23/19  Yes Sagardia, [name], MD  cetirizine (ZYRTEC) 10 MG tablet Take 10 mg by mouth daily as needed for allergies or rhinitis.   Yes [provider]  hydrochlorothiazide (MICROZIDE) 12.5 MG capsule Take 1 capsule (12.5 mg total) by mouth daily. 08/23/19  Yes Horald Pollen, MD    Allergies  Allergen Reactions  . Peanuts [Peanut Oil] Shortness Of Breath, Itching and Rash    Mouth and throat itch and this causes wheezing  . Azithromycin Hives    Patient Active Problem List   Diagnosis Date Noted  . Dyslipidemia  08/23/2019  . History of CVA (cerebrovascular accident) 08/23/2019  . Essential hypertension 03/06/2019  . Overweight 08/01/2013    Past Medical History:  Diagnosis Date  . Allergy   . Environmental allergies   . GERD (gastroesophageal reflux disease)   . Hypertension   . IBS (irritable bowel syndrome)     Past Surgical History:  Procedure Laterality Date  . COLECTOMY  2001  . UPPER GASTROINTESTINAL ENDOSCOPY      Social History   Socioeconomic History  . Marital status: Married    Spouse name: Not on file  . Number of children: 2  . Years of education: Not on file  . Highest education level: Not on file  Occupational History  . Not on file  Tobacco Use  . Smoking status: Former Smoker    Quit date: 04/26/1994    Years since quitting: 26.0  . Smokeless tobacco: Never Used  Substance and Sexual Activity  . Alcohol use: No  . Drug use: No  . Sexual activity: Yes    Partners: Female    Comment: with monogamous partner  Other Topics Concern  . Not on file  Social History Narrative   Pt is from [place], which is where he currently lives with his wife. 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    Social Determinants of Health   Financial  Resource Strain:   . Difficulty of Paying Living Expenses:   Food Insecurity:   . Worried About Charity fundraiser in the Last Year:   . Arboriculturist in the Last Year:   Transportation Needs:   . Film/video editor (Medical):   Marland Kitchen Lack of Transportation (Non-Medical):   Physical Activity:   . Days of Exercise per Week:   . Minutes of Exercise per Session:   Stress:   . Feeling of Stress :   Social Connections:   . Frequency of Communication with Friends and Family:   . Frequency of Social Gatherings with Friends and Family:   . Attends Religious Services:   . Active Member of Clubs or Organizations:   . Attends Archivist Meetings:   Marland Kitchen Marital Status:   Intimate Partner Violence:   . Fear of Current or Ex-Partner:    . Emotionally Abused:   Marland Kitchen Physically Abused:   . Sexually Abused:     Family History  Problem Relation Age of Onset  . Heart disease Father      Review of Systems  Constitutional: Negative.  Negative for chills and fever.  HENT: Negative.  Negative for congestion and sore throat.   Respiratory: Negative.  Negative for cough and shortness of breath.   Cardiovascular: Negative.  Negative for chest pain and palpitations.  Gastrointestinal: Negative.  Negative for abdominal pain, blood in stool, diarrhea, nausea and vomiting.  Genitourinary: Negative.  Negative for dysuria and hematuria.  Musculoskeletal: Negative.  Negative for myalgias.       Left foot pain  Skin: Negative.  Negative for rash.  Neurological: Negative.  Negative for dizziness and headaches.  All other systems reviewed and are negative.  Vitals:   05/22/20 1445 05/22/20 1447  BP: (!) 153/81 122/60  Pulse: 81   Temp: 97.7 F (36.5 C)   SpO2: 97%      Physical Exam Vitals reviewed.  Constitutional:      Appearance: Normal appearance.  HENT:     Head: Normocephalic.     Mouth/Throat:     Mouth: Mucous membranes are moist.     Pharynx: Oropharynx is clear.  Eyes:     Extraocular Movements: Extraocular movements intact.     Pupils: Pupils are equal, round, and reactive to light.  Cardiovascular:     Rate and Rhythm: Normal rate and regular rhythm.     Pulses: Normal pulses.     Heart sounds: Normal heart sounds.  Pulmonary:     Effort: Pulmonary effort is normal.     Breath sounds: Normal breath sounds.  Musculoskeletal:     Comments: Left foot: No erythema or ecchymosis.  Warm to touch.  Good peripheral pulses.  No skin changes or breakdowns.  Some tenderness to fifth metatarsal area.  Also mild tenderness to lateral ankle area.  No significant swelling.  Full range of motion.  Skin:    General: Skin is warm and dry.     Capillary Refill: Capillary refill takes less than 2 seconds.  Neurological:      General: No focal deficit present.     Mental Status: He is alert and oriented to person, place, and time.  Psychiatric:        Mood and Affect: Mood normal.        Behavior: Behavior normal.    DG Foot Complete Left  Result Date: 05/22/2020 CLINICAL DATA:  Foot pain EXAM: LEFT FOOT - COMPLETE 3+ VIEW COMPARISON:  None. FINDINGS: There is no evidence of fracture or dislocation. There is no evidence of arthropathy or other focal bone abnormality. Soft tissues are unremarkable. IMPRESSION: Negative. Electronically Signed   By: Donavan Foil M.D.   On: 05/22/2020 15:50   A total of 30 minutes was spent with the patient, greater than 50% of which was in counseling/coordination of care regarding chronic medical problems including hypertension and cardiovascular risk associated with this, review of all medications, review of most recent blood work results, review of most recent office visit notes, differential diagnosis of foot pain, review of x-ray with patient, prognosis and need for follow-up.   ASSESSMENT & PLAN: Justin Santos was seen today for medical management of chronic issues, right foot swelling and hypertension.  Diagnoses and all orders for this visit:  Essential hypertension -     Comprehensive metabolic panel -     Hemoglobin A1c  Dyslipidemia -     Comprehensive metabolic panel -     Hemoglobin A1c -     Lipid panel  Left foot pain -     DG Foot Complete Left -     Uric Acid -     meloxicam (MOBIC) 15 MG tablet; Take 1 tablet (15 mg total) by mouth daily for 7 days.     Patient Instructions       If you have lab work done today you will be contacted with your lab results within the next 2 weeks.  If you have not heard from [place] then please contact us. The fastest way to get your results is to register for My Chart.   IF you received an x-ray today, you will receive an invoice from [facility]. Please contact [facility] at [phone] with questions  or concerns regarding your invoice.   IF you received labwork today, you will receive an invoice from [facility]. Please contact [facility] at [phone] with questions or concerns regarding your invoice.   Our billing staff will not be able to assist you with questions regarding bills from these companies.  You will be contacted with the lab results as soon as they are available. The fastest way to get your results is to activate your My Chart account. Instructions are located on the last page of this paperwork. If you have not heard from [place] regarding the results in 2 weeks, please contact this office.      Foot Pain Many things can cause foot pain. Some common causes are:  An injury.  A sprain.  Arthritis.  Blisters.  Bunions. Follow these instructions at home: Managing pain, stiffness, and swelling If directed, put ice on the painful area:  Put ice in a plastic bag.  Place a towel between your skin and the bag.  Leave the ice on for 20 minutes, 2-3 times a day.  Activity  Do not stand or walk for long periods.  Return to your normal activities as told by your health care provider. Ask your health care provider what activities are safe for you.  Do stretches to relieve foot pain and stiffness as told by your health care provider.  Do not lift anything that is heavier than 10 lb (4.5 kg), or the limit that you are told, until your health care provider says that it is safe. Lifting a lot of weight can put added pressure on your feet. Lifestyle  Wear comfortable, supportive shoes that fit you well. Do not wear high heels.  Keep your feet clean and dry. General  instructions  Take over-the-counter and prescription medicines only as told by your health care provider.  Rub your foot gently.  Pay attention to any changes in your symptoms.  Keep all follow-up visits as told by your health care provider. This is important. Contact a health care provider if:  Your pain  does not get better after a few days of self-care.  Your pain gets worse.  You cannot stand on your foot. Get help right away if:  Your foot is numb or tingling.  Your foot or toes are swollen.  Your foot or toes turn white or blue.  You have warmth and redness along your foot. Summary  Common causes of foot pain are injury, sprain, arthritis, blisters or bunions.  Ice, medicines, and comfortable shoes may help foot pain.  Contact your health care provider if your pain does not get better after a few days of self-care. This information is not intended to replace advice given to you by your health care provider. Make sure you discuss any questions you have with your health care provider. Document Revised: 09/29/2018 Document Reviewed: 09/29/2018 Elsevier Patient Education  [place].  Hypertension, Adult High blood pressure (hypertension) is when the force of blood pumping through the arteries is too strong. The arteries are the blood vessels that carry blood from the heart throughout the body. Hypertension forces the heart to work harder to pump blood and may cause arteries to become narrow or stiff. Untreated or uncontrolled hypertension can cause a heart attack, heart failure, a stroke, kidney disease, and other problems. A blood pressure reading consists of a higher number over a lower number. Ideally, your blood pressure should be below 120/80. The first ("top") number is called the systolic pressure. It is a measure of the pressure in your arteries as your heart beats. The second ("bottom") number is called the diastolic pressure. It is a measure of the pressure in your arteries as the heart relaxes. What are the causes? The exact cause of this condition is not known. There are some conditions that result in or are related to high blood pressure. What increases the risk? Some risk factors for high blood pressure are under your control. The following factors may make you  more likely to develop this condition:  Smoking.  Having type 2 diabetes mellitus, high cholesterol, or both.  Not getting enough exercise or physical activity.  Being overweight.  Having too much fat, sugar, calories, or salt (sodium) in your diet.  Drinking too much alcohol. Some risk factors for high blood pressure may be difficult or impossible to change. Some of these factors include:  Having chronic kidney disease.  Having a family history of high blood pressure.  Age. Risk increases with age.  Race. You may be at higher risk if you are African American.  Gender. Men are at higher risk than women before age 35. After age 37, women are at higher risk than men.  Having obstructive sleep apnea.  Stress. What are the signs or symptoms? High blood pressure may not cause symptoms. Very high blood pressure (hypertensive crisis) may cause:  Headache.  Anxiety.  Shortness of breath.  Nosebleed.  Nausea and vomiting.  Vision changes.  Severe chest pain.  Seizures. How is this diagnosed? This condition is diagnosed by measuring your blood pressure while you are seated, with your arm resting on a flat surface, your legs uncrossed, and your feet flat on the floor. The cuff of the blood pressure monitor  will be placed directly against the skin of your upper arm at the level of your heart. It should be measured at least twice using the same arm. Certain conditions can cause a difference in blood pressure between your right and left arms. Certain factors can cause blood pressure readings to be lower or higher than normal for a short period of time:  When your blood pressure is higher when you are in a health care provider's office than when you are at home, this is called white coat hypertension. Most people with this condition do not need medicines.  When your blood pressure is higher at home than when you are in a health care provider's office, this is called masked  hypertension. Most people with this condition may need medicines to control blood pressure. If you have a high blood pressure reading during one visit or you have normal blood pressure with other risk factors, you may be asked to:  Return on a different day to have your blood pressure checked again.  Monitor your blood pressure at home for 1 week or longer. If you are diagnosed with hypertension, you may have other blood or imaging tests to help your health care provider understand your overall risk for other conditions. How is this treated? This condition is treated by making healthy lifestyle changes, such as eating healthy foods, exercising more, and reducing your alcohol intake. Your health care provider may prescribe medicine if lifestyle changes are not enough to get your blood pressure under control, and if:  Your systolic blood pressure is above 130.  Your diastolic blood pressure is above 80. Your personal target blood pressure may vary depending on your medical conditions, your age, and other factors. Follow these instructions at home: Eating and drinking   Eat a diet that is high in fiber and potassium, and low in sodium, added sugar, and fat. An example eating plan is called the DASH (Dietary Approaches to Stop Hypertension) diet. To eat this way: ? Eat plenty of fresh fruits and vegetables. Try to fill one half of your plate at each meal with fruits and vegetables. ? Eat whole grains, such as whole-wheat pasta, [place] rice, or whole-grain bread. Fill about one fourth of your plate with whole grains. ? Eat or drink low-fat dairy products, such as skim milk or low-fat yogurt. ? Avoid fatty cuts of meat, processed or cured meats, and poultry with skin. Fill about one fourth of your plate with lean proteins, such as fish, chicken without skin, beans, eggs, or tofu. ? Avoid pre-made and processed foods. These tend to be higher in sodium, added sugar, and fat.  Reduce your daily sodium  intake. Most people with hypertension should eat less than 1,500 mg of sodium a day.  Do not drink alcohol if: ? Your health care provider tells you not to drink. ? You are pregnant, may be pregnant, or are planning to become pregnant.  If you drink alcohol: ? Limit how much you use to:  0-1 drink a day for women.  0-2 drinks a day for men. ? Be aware of how much alcohol is in your drink. In the [place] one drink equals one 12 oz bottle of beer (355 mL), one 5 oz glass of wine (148 mL), or one 1 oz glass of hard liquor (44 mL). Lifestyle   Work with your health care provider to maintain a healthy body weight or to lose weight. Ask what an ideal weight is for you.  Get  at least 30 minutes of exercise most days of the week. Activities may include walking, swimming, or biking.  Include exercise to strengthen your muscles (resistance exercise), such as Pilates or lifting weights, as part of your weekly exercise routine. Try to do these types of exercises for 30 minutes at least 3 days a week.  Do not use any products that contain nicotine or tobacco, such as cigarettes, e-cigarettes, and chewing tobacco. If you need help quitting, ask your health care provider.  Monitor your blood pressure at home as told by your health care provider.  Keep all follow-up visits as told by your health care provider. This is important. Medicines  Take over-the-counter and prescription medicines only as told by your health care provider. Follow directions carefully. Blood pressure medicines must be taken as prescribed.  Do not skip doses of blood pressure medicine. Doing this puts you at risk for problems and can make the medicine less effective.  Ask your health care provider about side effects or reactions to medicines that you should watch for. Contact a health care provider if you:  Think you are having a reaction to a medicine you are taking.  Have headaches that keep coming back (recurring).   Feel dizzy.  Have swelling in your ankles.  Have trouble with your vision. Get help right away if you:  Develop a severe headache or confusion.  Have unusual weakness or numbness.  Feel faint.  Have severe pain in your chest or abdomen.  Vomit repeatedly.  Have trouble breathing. Summary  Hypertension is when the force of blood pumping through your arteries is too strong. If this condition is not controlled, it may put you at risk for serious complications.  Your personal target blood pressure may vary depending on your medical conditions, your age, and other factors. For most people, a normal blood pressure is less than 120/80.  Hypertension is treated with lifestyle changes, medicines, or a combination of both. Lifestyle changes include losing weight, eating a healthy, low-sodium diet, exercising more, and limiting alcohol. This information is not intended to replace advice given to you by your health care provider. Make sure you discuss any questions you have with your health care provider. Document Revised: 08/24/2018 Document Reviewed: 08/24/2018 Elsevier Patient Education  2020 [employer] Inc.       Agustina Caroli, MD [facility]

## 2020-05-23 LAB — COMPREHENSIVE METABOLIC PANEL
ALT: 16 IU/L (ref 0–44)
AST: 22 IU/L (ref 0–40)
Albumin/Globulin Ratio: 1.4 (ref 1.2–2.2)
Albumin: 4.6 g/dL (ref 3.8–4.8)
Alkaline Phosphatase: 65 IU/L (ref 48–121)
BUN/Creatinine Ratio: 9 — ABNORMAL LOW (ref 10–24)
BUN: 11 mg/dL (ref 8–27)
Bilirubin Total: 1.2 mg/dL (ref 0.0–1.2)
CO2: 22 mmol/L (ref 20–29)
Calcium: 9.7 mg/dL (ref 8.6–10.2)
Chloride: 103 mmol/L (ref 96–106)
Creatinine, Ser: 1.2 mg/dL (ref 0.76–1.27)
GFR calc Af Amer: 73 mL/min/{1.73_m2} (ref >59)
GFR calc non Af Amer: 64 mL/min/{1.73_m2} (ref >59)
Globulin, Total: 3.3 g/dL (ref 1.5–4.5)
Glucose: 96 mg/dL (ref 65–99)
Potassium: 3.9 mmol/L (ref 3.5–5.2)
Sodium: 138 mmol/L (ref 134–144)
Total Protein: 7.9 g/dL (ref 6.0–8.5)

## 2020-05-23 LAB — HEMOGLOBIN A1C
Est. average glucose Bld gHb Est-mCnc: 128 mg/dL
Hgb A1c MFr Bld: 6.1 % — ABNORMAL HIGH (ref 4.8–5.6)

## 2020-05-23 LAB — LIPID PANEL
Chol/HDL Ratio: 2.7 ratio (ref 0.0–5.0)
Cholesterol, Total: 178 mg/dL (ref 100–199)
HDL: 65 mg/dL (ref >39)
LDL Chol Calc (NIH): 97 mg/dL (ref 0–99)
Triglycerides: 86 mg/dL (ref 0–149)
VLDL Cholesterol Cal: 16 mg/dL (ref 5–40)

## 2020-05-23 LAB — URIC ACID: Uric Acid: 8 mg/dL (ref 3.8–8.4)

## 2020-11-20 ENCOUNTER — Encounter: Payer: Self-pay | Admitting: Emergency Medicine

## 2020-11-20 ENCOUNTER — Ambulatory Visit (INDEPENDENT_AMBULATORY_CARE_PROVIDER_SITE_OTHER): Payer: Medicare Other | Admitting: Emergency Medicine

## 2020-11-20 ENCOUNTER — Other Ambulatory Visit: Payer: Self-pay

## 2020-11-20 VITALS — BP 130/70 | HR 74 | Temp 98.5°F | Ht 70.0 in | Wt 213.4 lb

## 2020-11-20 DIAGNOSIS — M79672 Pain in left foot: Secondary | ICD-10-CM | POA: Diagnosis not present

## 2020-11-20 DIAGNOSIS — E785 Hyperlipidemia, unspecified: Secondary | ICD-10-CM | POA: Diagnosis not present

## 2020-11-20 DIAGNOSIS — M461 Sacroiliitis, not elsewhere classified: Secondary | ICD-10-CM | POA: Diagnosis not present

## 2020-11-20 DIAGNOSIS — I10 Essential (primary) hypertension: Secondary | ICD-10-CM

## 2020-11-20 DIAGNOSIS — Z23 Encounter for immunization: Secondary | ICD-10-CM | POA: Diagnosis not present

## 2020-11-20 NOTE — Progress Notes (Signed)
Shauna Hugh 64 y.o.   Chief Complaint  Patient presents with  . Hypertension    60 m f/u     HISTORY OF PRESENT ILLNESS: This is a 64 y.o. male with history of hypertension here for follow-up. Presently taking amlodipine and hydrochlorothiazide.  Did not take medication this morning. Blood pressure readings at home 130s over 70s. Also complaining of chronic pain to left foot. No other complaints or medical concerns today.  HPI   Prior to Admission medications   Medication Sig Start Date End Date Taking? Authorizing Provider  acetaminophen (TYLENOL) 500 MG tablet Take 500-1,000 mg by mouth every 6 (six) hours as needed for mild pain or headache.   Yes [provider]  amLODipine (NORVASC) 10 MG tablet Take 1 tablet (10 mg total) by mouth daily. 08/23/19 11/20/20 Yes SagardiaInes Bloomer, MD  aspirin EC 81 MG tablet Take 81 mg by mouth daily.   Yes [provider]  atorvastatin (LIPITOR) 20 MG tablet Take 1 tablet (20 mg total) by mouth daily. 08/23/19  Yes Kriston Pasquarello, [name], MD  cetirizine (ZYRTEC) 10 MG tablet Take 10 mg by mouth daily as needed for allergies or rhinitis.   Yes [provider]  hydrochlorothiazide (MICROZIDE) 12.5 MG capsule Take 1 capsule (12.5 mg total) by mouth daily. 08/23/19  Yes Horald Pollen, MD    Allergies  Allergen Reactions  . Peanuts [Peanut Oil] Shortness Of Breath, Itching and Rash    Mouth and throat itch and this causes wheezing  . Azithromycin Hives    Patient Active Problem List   Diagnosis Date Noted  . Dyslipidemia 08/23/2019  . History of CVA (cerebrovascular accident) 08/23/2019  . Essential hypertension 03/06/2019  . Overweight 08/01/2013    Past Medical History:  Diagnosis Date  . Allergy   . Environmental allergies   . GERD (gastroesophageal reflux disease)   . Hypertension   . IBS (irritable bowel syndrome)     Past Surgical History:  Procedure Laterality Date  . COLECTOMY  2001    . UPPER GASTROINTESTINAL ENDOSCOPY      Social History   Socioeconomic History  . Marital status: Married    Spouse name: Not on file  . Number of children: 2  . Years of education: Not on file  . Highest education level: Not on file  Occupational History  . Not on file  Tobacco Use  . Smoking status: Former Smoker    Quit date: 04/26/1994    Years since quitting: 26.5  . Smokeless tobacco: Never Used  Vaping Use  . Vaping Use: Never used  Substance and Sexual Activity  . Alcohol use: No  . Drug use: No  . Sexual activity: Yes    Partners: Female    Comment: with monogamous partner  Other Topics Concern  . Not on file  Social History Narrative   Pt is from [place], which is where he currently lives with his wife. 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    Social Determinants of Health   Financial Resource Strain:   . Difficulty of Paying Living Expenses: Not on file  Food Insecurity:   . Worried About Charity fundraiser in the Last Year: Not on file  . Ran Out of Food in the Last Year: Not on file  Transportation Needs:   . Lack of Transportation (Medical): Not on file  . Lack of Transportation (Non-Medical): Not on file  Physical Activity:   . Days of Exercise  per Week: Not on file  . Minutes of Exercise per Session: Not on file  Stress:   . Feeling of Stress : Not on file  Social Connections:   . Frequency of Communication with Friends and Family: Not on file  . Frequency of Social Gatherings with Friends and Family: Not on file  . Attends Religious Services: Not on file  . Active Member of Clubs or Organizations: Not on file  . Attends Archivist Meetings: Not on file  . Marital Status: Not on file  Intimate Partner Violence:   . Fear of Current or Ex-Partner: Not on file  . Emotionally Abused: Not on file  . Physically Abused: Not on file  . Sexually Abused: Not on file    Family History  Problem Relation Age of Onset  . Heart disease Father       Review of Systems  Constitutional: Negative.  Negative for chills and fever.  HENT: Negative.  Negative for congestion and sore throat.   Respiratory: Negative.  Negative for cough and shortness of breath.   Cardiovascular: Negative.  Negative for chest pain and palpitations.  Gastrointestinal: Negative.  Negative for abdominal pain, diarrhea, nausea and vomiting.  Genitourinary: Negative.  Negative for dysuria and hematuria.  Musculoskeletal: Negative.   Skin: Negative.  Negative for rash.  All other systems reviewed and are negative.   Vitals:   11/20/20 1542 11/20/20 1548  BP: (!) 172/76 140/72  Pulse: 74   Temp: 98.5 F (36.9 C)   SpO2: 97%    BP Readings from Last 3 Encounters:  11/20/20 140/72  05/22/20 122/60  11/21/19 121/65    Physical Exam Vitals reviewed.  Constitutional:      Appearance: Normal appearance.  HENT:     Head: Normocephalic.  Eyes:     Extraocular Movements: Extraocular movements intact.     Conjunctiva/sclera: Conjunctivae normal.     Pupils: Pupils are equal, round, and reactive to light.  Cardiovascular:     Rate and Rhythm: Normal rate and regular rhythm.     Pulses: Normal pulses.     Heart sounds: Normal heart sounds.  Pulmonary:     Effort: Pulmonary effort is normal.     Breath sounds: Normal breath sounds.  Abdominal:     General: There is no distension.     Palpations: Abdomen is soft.     Tenderness: There is no abdominal tenderness.  Musculoskeletal:        General: Normal range of motion.     Cervical back: Normal range of motion and neck supple.  Skin:    General: Skin is warm and dry.     Capillary Refill: Capillary refill takes less than 2 seconds.  Neurological:     General: No focal deficit present.     Mental Status: He is alert and oriented to person, place, and time.  Psychiatric:        Mood and Affect: Mood normal.        Behavior: Behavior normal.     A total of 30 minutes was spent with the patient,  greater than 50% of which was in counseling/coordination of care regarding hypertension and dyslipidemia and cardiovascular risks associated with these conditions, review of all medications, review of most recent office visit notes, review of most recent blood work results, health maintenance items, education on nutrition, prognosis, documentation, and need for follow-up.   ASSESSMENT & PLAN: Essential hypertension Well-controlled hypertension.  Continue present medications.  No changes.  Diet and nutrition discussed. Follow-up in 6 months.  Kivon was seen today for hypertension.  Diagnoses and all orders for this visit:  Essential hypertension  Dyslipidemia  Left foot pain -     Ambulatory referral to Podiatry  Need for immunization against influenza -     Flu Vaccine QUAD High Dose(Fluad)  Sacroiliitis ([place])    Patient Instructions       If you have lab work done today you will be contacted with your lab results within the next 2 weeks.  If you have not heard from [place] then please contact us. The fastest way to get your results is to register for My Chart.   IF you received an x-ray today, you will receive an invoice from [facility]. Please contact [facility] at [phone] with questions or concerns regarding your invoice.   IF you received labwork today, you will receive an invoice from [place]. Please contact [facility] at [phone] with questions or concerns regarding your invoice.   Our billing staff will not be able to assist you with questions regarding bills from these companies.  You will be contacted with the lab results as soon as they are available. The fastest way to get your results is to activate your My Chart account. Instructions are located on the last page of this paperwork. If you have not heard from [place] regarding the results in 2 weeks, please contact this office.      Hypertension, Adult High blood pressure (hypertension) is  when the force of blood pumping through the arteries is too strong. The arteries are the blood vessels that carry blood from the heart throughout the body. Hypertension forces the heart to work harder to pump blood and may cause arteries to become narrow or stiff. Untreated or uncontrolled hypertension can cause a heart attack, heart failure, a stroke, kidney disease, and other problems. A blood pressure reading consists of a higher number over a lower number. Ideally, your blood pressure should be below 120/80. The first ("top") number is called the systolic pressure. It is a measure of the pressure in your arteries as your heart beats. The second ("bottom") number is called the diastolic pressure. It is a measure of the pressure in your arteries as the heart relaxes. What are the causes? The exact cause of this condition is not known. There are some conditions that result in or are related to high blood pressure. What increases the risk? Some risk factors for high blood pressure are under your control. The following factors may make you more likely to develop this condition:  Smoking.  Having type 2 diabetes mellitus, high cholesterol, or both.  Not getting enough exercise or physical activity.  Being overweight.  Having too much fat, sugar, calories, or salt (sodium) in your diet.  Drinking too much alcohol. Some risk factors for high blood pressure may be difficult or impossible to change. Some of these factors include:  Having chronic kidney disease.  Having a family history of high blood pressure.  Age. Risk increases with age.  Race. You may be at higher risk if you are African American.  Gender. Men are at higher risk than women before age 28. After age 50, women are at higher risk than men.  Having obstructive sleep apnea.  Stress. What are the signs or symptoms? High blood pressure may not cause symptoms. Very high blood pressure (hypertensive crisis) may  cause:  Headache.  Anxiety.  Shortness of breath.  Nosebleed.  Nausea  and vomiting.  Vision changes.  Severe chest pain.  Seizures. How is this diagnosed? This condition is diagnosed by measuring your blood pressure while you are seated, with your arm resting on a flat surface, your legs uncrossed, and your feet flat on the floor. The cuff of the blood pressure monitor will be placed directly against the skin of your upper arm at the level of your heart. It should be measured at least twice using the same arm. Certain conditions can cause a difference in blood pressure between your right and left arms. Certain factors can cause blood pressure readings to be lower or higher than normal for a short period of time:  When your blood pressure is higher when you are in a health care provider's office than when you are at home, this is called white coat hypertension. Most people with this condition do not need medicines.  When your blood pressure is higher at home than when you are in a health care provider's office, this is called masked hypertension. Most people with this condition may need medicines to control blood pressure. If you have a high blood pressure reading during one visit or you have normal blood pressure with other risk factors, you may be asked to:  Return on a different day to have your blood pressure checked again.  Monitor your blood pressure at home for 1 week or longer. If you are diagnosed with hypertension, you may have other blood or imaging tests to help your health care provider understand your overall risk for other conditions. How is this treated? This condition is treated by making healthy lifestyle changes, such as eating healthy foods, exercising more, and reducing your alcohol intake. Your health care provider may prescribe medicine if lifestyle changes are not enough to get your blood pressure under control, and if:  Your systolic blood pressure is above  130.  Your diastolic blood pressure is above 80. Your personal target blood pressure may vary depending on your medical conditions, your age, and other factors. Follow these instructions at home: Eating and drinking   Eat a diet that is high in fiber and potassium, and low in sodium, added sugar, and fat. An example eating plan is called the DASH (Dietary Approaches to Stop Hypertension) diet. To eat this way: ? Eat plenty of fresh fruits and vegetables. Try to fill one half of your plate at each meal with fruits and vegetables. ? Eat whole grains, such as whole-wheat pasta, brown rice, or whole-grain bread. Fill about one fourth of your plate with whole grains. ? Eat or drink low-fat dairy products, such as skim milk or low-fat yogurt. ? Avoid fatty cuts of meat, processed or cured meats, and poultry with skin. Fill about one fourth of your plate with lean proteins, such as fish, chicken without skin, beans, eggs, or tofu. ? Avoid pre-made and processed foods. These tend to be higher in sodium, added sugar, and fat.  Reduce your daily sodium intake. Most people with hypertension should eat less than 1,500 mg of sodium a day.  Do not drink alcohol if: ? Your health care provider tells you not to drink. ? You are pregnant, may be pregnant, or are planning to become pregnant.  If you drink alcohol: ? Limit how much you use to:  0-1 drink a day for women.  0-2 drinks a day for men. ? Be aware of how much alcohol is in your drink. In the [place] one drink equals one 12 oz  bottle of beer (355 mL), one 5 oz glass of wine (148 mL), or one 1 oz glass of hard liquor (44 mL). Lifestyle   Work with your health care provider to maintain a healthy body weight or to lose weight. Ask what an ideal weight is for you.  Get at least 30 minutes of exercise most days of the week. Activities may include walking, swimming, or biking.  Include exercise to strengthen your muscles (resistance exercise),  such as Pilates or lifting weights, as part of your weekly exercise routine. Try to do these types of exercises for 30 minutes at least 3 days a week.  Do not use any products that contain nicotine or tobacco, such as cigarettes, e-cigarettes, and chewing tobacco. If you need help quitting, ask your health care provider.  Monitor your blood pressure at home as told by your health care provider.  Keep all follow-up visits as told by your health care provider. This is important. Medicines  Take over-the-counter and prescription medicines only as told by your health care provider. Follow directions carefully. Blood pressure medicines must be taken as prescribed.  Do not skip doses of blood pressure medicine. Doing this puts you at risk for problems and can make the medicine less effective.  Ask your health care provider about side effects or reactions to medicines that you should watch for. Contact a health care provider if you:  Think you are having a reaction to a medicine you are taking.  Have headaches that keep coming back (recurring).  Feel dizzy.  Have swelling in your ankles.  Have trouble with your vision. Get help right away if you:  Develop a severe headache or confusion.  Have unusual weakness or numbness.  Feel faint.  Have severe pain in your chest or abdomen.  Vomit repeatedly.  Have trouble breathing. Summary  Hypertension is when the force of blood pumping through your arteries is too strong. If this condition is not controlled, it may put you at risk for serious complications.  Your personal target blood pressure may vary depending on your medical conditions, your age, and other factors. For most people, a normal blood pressure is less than 120/80.  Hypertension is treated with lifestyle changes, medicines, or a combination of both. Lifestyle changes include losing weight, eating a healthy, low-sodium diet, exercising more, and limiting alcohol. This  information is not intended to replace advice given to you by your health care provider. Make sure you discuss any questions you have with your health care provider. Document Revised: 08/24/2018 Document Reviewed: 08/24/2018 Elsevier Patient Education  2020 [employer]       Agustina Caroli, MD [facility]

## 2020-11-20 NOTE — Patient Instructions (Addendum)
   If you have lab work done today you will be contacted with your lab results within the next 2 weeks.  If you have not heard from us then please contact us. The fastest way to get your results is to register for My Chart.   IF you received an x-ray today, you will receive an invoice from [facility]. Please contact [facility] at [phone] with questions or concerns regarding your invoice.   IF you received labwork today, you will receive an invoice from [facility]. Please contact [facility] at [phone] with questions or concerns regarding your invoice.   Our billing staff will not be able to assist you with questions regarding bills from these companies.  You will be contacted with the lab results as soon as they are available. The fastest way to get your results is to activate your My Chart account. Instructions are located on the last page of this paperwork. If you have not heard from us regarding the results in 2 weeks, please contact this office.      Hypertension, Adult High blood pressure (hypertension) is when the force of blood pumping through the arteries is too strong. The arteries are the blood vessels that carry blood from the heart throughout the body. Hypertension forces the heart to work harder to pump blood and may cause arteries to become narrow or stiff. Untreated or uncontrolled hypertension can cause a heart attack, heart failure, a stroke, kidney disease, and other problems. A blood pressure reading consists of a higher number over a lower number. Ideally, your blood pressure should be below 120/80. The first ("top") number is called the systolic pressure. It is a measure of the pressure in your arteries as your heart beats. The second ("bottom") number is called the diastolic pressure. It is a measure of the pressure in your arteries as the heart relaxes. What are the causes? The exact cause of this condition is not known. There are some conditions  that result in or are related to high blood pressure. What increases the risk? Some risk factors for high blood pressure are under your control. The following factors may make you more likely to develop this condition:  Smoking.  Having type 2 diabetes mellitus, high cholesterol, or both.  Not getting enough exercise or physical activity.  Being overweight.  Having too much fat, sugar, calories, or salt (sodium) in your diet.  Drinking too much alcohol. Some risk factors for high blood pressure may be difficult or impossible to change. Some of these factors include:  Having chronic kidney disease.  Having a family history of high blood pressure.  Age. Risk increases with age.  Race. You may be at higher risk if you are African American.  Gender. Men are at higher risk than women before age 45. After age 65, women are at higher risk than men.  Having obstructive sleep apnea.  Stress. What are the signs or symptoms? High blood pressure may not cause symptoms. Very high blood pressure (hypertensive crisis) may cause:  Headache.  Anxiety.  Shortness of breath.  Nosebleed.  Nausea and vomiting.  Vision changes.  Severe chest pain.  Seizures. How is this diagnosed? This condition is diagnosed by measuring your blood pressure while you are seated, with your arm resting on a flat surface, your legs uncrossed, and your feet flat on the floor. The cuff of the blood pressure monitor will be placed directly against the skin of your upper arm at the level of your   heart. It should be measured at least twice using the same arm. Certain conditions can cause a difference in blood pressure between your right and left arms. Certain factors can cause blood pressure readings to be lower or higher than normal for a short period of time:  When your blood pressure is higher when you are in a health care provider's office than when you are at home, this is called white coat hypertension.  Most people with this condition do not need medicines.  When your blood pressure is higher at home than when you are in a health care provider's office, this is called masked hypertension. Most people with this condition may need medicines to control blood pressure. If you have a high blood pressure reading during one visit or you have normal blood pressure with other risk factors, you may be asked to:  Return on a different day to have your blood pressure checked again.  Monitor your blood pressure at home for 1 week or longer. If you are diagnosed with hypertension, you may have other blood or imaging tests to help your health care provider understand your overall risk for other conditions. How is this treated? This condition is treated by making healthy lifestyle changes, such as eating healthy foods, exercising more, and reducing your alcohol intake. Your health care provider may prescribe medicine if lifestyle changes are not enough to get your blood pressure under control, and if:  Your systolic blood pressure is above 130.  Your diastolic blood pressure is above 80. Your personal target blood pressure may vary depending on your medical conditions, your age, and other factors. Follow these instructions at home: Eating and drinking   Eat a diet that is high in fiber and potassium, and low in sodium, added sugar, and fat. An example eating plan is called the DASH (Dietary Approaches to Stop Hypertension) diet. To eat this way: ? Eat plenty of fresh fruits and vegetables. Try to fill one half of your plate at each meal with fruits and vegetables. ? Eat whole grains, such as whole-wheat pasta, brown rice, or whole-grain bread. Fill about one fourth of your plate with whole grains. ? Eat or drink low-fat dairy products, such as skim milk or low-fat yogurt. ? Avoid fatty cuts of meat, processed or cured meats, and poultry with skin. Fill about one fourth of your plate with lean proteins, such  as fish, chicken without skin, beans, eggs, or tofu. ? Avoid pre-made and processed foods. These tend to be higher in sodium, added sugar, and fat.  Reduce your daily sodium intake. Most people with hypertension should eat less than 1,500 mg of sodium a day.  Do not drink alcohol if: ? Your health care provider tells you not to drink. ? You are pregnant, may be pregnant, or are planning to become pregnant.  If you drink alcohol: ? Limit how much you use to:  0-1 drink a day for women.  0-2 drinks a day for men. ? Be aware of how much alcohol is in your drink. In the [place] one drink equals one 12 oz bottle of beer (355 mL), one 5 oz glass of wine (148 mL), or one 1 oz glass of hard liquor (44 mL). Lifestyle   Work with your health care provider to maintain a healthy body weight or to lose weight. Ask what an ideal weight is for you.  Get at least 30 minutes of exercise most days of the week. Activities may include walking, swimming,   or biking.  Include exercise to strengthen your muscles (resistance exercise), such as Pilates or lifting weights, as part of your weekly exercise routine. Try to do these types of exercises for 30 minutes at least 3 days a week.  Do not use any products that contain nicotine or tobacco, such as cigarettes, e-cigarettes, and chewing tobacco. If you need help quitting, ask your health care provider.  Monitor your blood pressure at home as told by your health care provider.  Keep all follow-up visits as told by your health care provider. This is important. Medicines  Take over-the-counter and prescription medicines only as told by your health care provider. Follow directions carefully. Blood pressure medicines must be taken as prescribed.  Do not skip doses of blood pressure medicine. Doing this puts you at risk for problems and can make the medicine less effective.  Ask your health care provider about side effects or reactions to medicines that you  should watch for. Contact a health care provider if you:  Think you are having a reaction to a medicine you are taking.  Have headaches that keep coming back (recurring).  Feel dizzy.  Have swelling in your ankles.  Have trouble with your vision. Get help right away if you:  Develop a severe headache or confusion.  Have unusual weakness or numbness.  Feel faint.  Have severe pain in your chest or abdomen.  Vomit repeatedly.  Have trouble breathing. Summary  Hypertension is when the force of blood pumping through your arteries is too strong. If this condition is not controlled, it may put you at risk for serious complications.  Your personal target blood pressure may vary depending on your medical conditions, your age, and other factors. For most people, a normal blood pressure is less than 120/80.  Hypertension is treated with lifestyle changes, medicines, or a combination of both. Lifestyle changes include losing weight, eating a healthy, low-sodium diet, exercising more, and limiting alcohol. This information is not intended to replace advice given to you by your health care provider. Make sure you discuss any questions you have with your health care provider. Document Revised: 08/24/2018 Document Reviewed: 08/24/2018 Elsevier Patient Education  2020 [employer]  

## 2020-11-20 NOTE — Assessment & Plan Note (Signed)
Well-controlled hypertension.  Continue present medications.  No changes. Diet and nutrition discussed. Follow-up in 6 months. 

## 2020-12-05 ENCOUNTER — Ambulatory Visit: Payer: Medicare Other | Admitting: Podiatry

## 2020-12-05 ENCOUNTER — Other Ambulatory Visit: Payer: Self-pay

## 2020-12-05 ENCOUNTER — Ambulatory Visit: Payer: Medicare Other

## 2020-12-05 DIAGNOSIS — M79672 Pain in left foot: Secondary | ICD-10-CM

## 2020-12-05 DIAGNOSIS — M7672 Peroneal tendinitis, left leg: Secondary | ICD-10-CM | POA: Diagnosis not present

## 2020-12-05 DIAGNOSIS — M24472 Recurrent dislocation, left ankle: Secondary | ICD-10-CM | POA: Diagnosis not present

## 2020-12-05 NOTE — Patient Instructions (Signed)
Look for Voltaren gel at the pharmacy over the counter or online (also known as diclofenac 1% gel). Apply to the painful areas 3-4x daily with the supplied dosing card. Allow to dry for 10 minutes before going into socks/shoes   Peroneal Tendinopathy Rehab Ask your health care provider which exercises are safe for you. Do exercises exactly as told by your health care provider and adjust them as directed. It is normal to feel mild stretching, pulling, tightness, or discomfort as you do these exercises. Stop right away if you feel sudden pain or your pain gets worse. Do not begin these exercises until told by your health care provider. Stretching and range-of-motion exercises These exercises warm up your muscles and joints and improve the movement and flexibility of your ankle. These exercises also help to relieve pain and stiffness. Gastroc and soleus stretch, standing  This is an exercise in which you stand on a step and use your body weight to stretch your calf muscles. To do this exercise: 1. Stand on the edge of a step on the ball of your left / right foot. The ball of your foot is on the walking surface, right under your toes. 2. Keep your other foot firmly on the same step. 3. Hold on to the wall, a railing, or a chair for balance. 4. Slowly lift your other foot, allowing your body weight to press your left / right heel down over the edge of the step. You should feel a stretch in your left / right calf (gastrocnemius and soleus). 5. Hold this position for 15 seconds. 6. Return both feet to the step. 7. Repeat this exercise with a slight bend in your left / right knee. Repeat 5 times with your left / right knee straight and 5 times with your left / right knee bent. Complete this exercise 2 times a day. Strengthening exercises These exercises build strength and endurance in your foot and ankle. Endurance is the ability to use your muscles for a long time, even after they get tired. Ankle  dorsiflexion with band   1. Secure a rubber exercise band or tube to an object, such as a table leg, that will not move when the band is pulled. 2. Secure the other end of the band around your left / right foot. 3. Sit on the floor, facing the object with your left / right leg extended. The band or tube should be slightly tense when your foot is relaxed. 4. Slowly flex your left / right ankle and toes to bring your foot toward you (dorsiflexion). 5. Hold this position for 15 seconds. 6. Let the band or tube slowly pull your foot back to the starting position. Repeat 5 times. Complete this exercise 2 times a day. Ankle eversion 1. Sit on the floor with your legs straight out in front of you. 2. Loop a rubber exercise band or tube around the ball of your left / right foot. The ball of your foot is on the walking surface, right under your toes. 3. Hold the ends of the band in your hands, or secure the band to a stable object. The band or tube should be slightly tense when your foot is relaxed. 4. Slowly push your foot outward, away from your other leg (eversion). 5. Hold this position for 15 seconds. 6. Slowly return your foot to the starting position. Repeat 5 times. Complete this exercise 2 times a day. Plantar flexion, standing  This exercise is sometimes called standing heel   raise. 1. Stand with your feet shoulder-width apart. 2. Place your hands on a wall or table to steady yourself as needed, but try not to use it for support. 3. Keep your weight spread evenly over the width of your feet while you slowly rise up on your toes (plantar flexion). If told by your health care provider: ? Shift your weight toward your left / right leg until you feel challenged. ? Stand on your left / right leg only. 4. Hold this position for 15 seconds. Repeat 2 times. Complete this exercise 2 times a day. Single leg stand 1. Without shoes, stand near a railing or in a doorway. You may hold on to the  railing or door frame as needed. 2. Stand on your left / right foot. Keep your big toe down on the floor and try to keep your arch lifted. ? Do not roll to the outside of your foot. ? If this exercise is too easy, you can try it with your eyes closed or while standing on a pillow. 3. Hold this position for 15 seconds. Repeat 5 times. Complete this exercise 2 times a day. This information is not intended to replace advice given to you by your health care provider. Make sure you discuss any questions you have with your health care provider. Document Revised: 04/04/2019 Document Reviewed: 04/04/2019 Elsevier Patient Education  2020 [employer] Inc.  

## 2020-12-06 ENCOUNTER — Encounter: Payer: Self-pay | Admitting: Podiatry

## 2020-12-06 NOTE — Progress Notes (Signed)
  Subjective:  Patient ID: Justin Santos, male    DOB: Oct 24, 1956,  MRN: [id number]  Chief Complaint  Patient presents with  . Foot Pain    PT STATES HE HAS HAD LEFT FOOT PAIN ON TOP AND SIDE OF FOOT NEAR THE BOTTOM OF THE 5TH TOE FOR ABOUT A YEAR.     64 y.o. male presents with the above complaint. History confirmed with patient.  This is been present for about 1 year.  He feels across the top of the foot and towards the outside of the left ankle.  Describes it as a throbbing pain that has a clicking sensation.  He is tried different shoes without help.  Objective:  Physical Exam: warm, good capillary refill, no trophic changes or ulcerative lesions, normal DP and PT pulses and normal sensory exam. Left Foot: Pain on palpation of the peroneal tendons from the fifth metatarsal base to the retromalleolar groove.  He has subluxation and painful clicking of the peroneal tendons with foot circumduction.  No gross ankle instability  Radiographs: X-ray of the left foot: no fracture, dislocation, swelling or degenerative changes noted Assessment:   1. Foot pain, left   2. 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 Peroneal tendinitis of left lower extremity      Plan:  Patient was evaluated and treated and all questions answered.  Suspect he has peritoneal tendinopathy with likely tears as well as chronic peroneal instability.  I recommend we evaluate with an MRI left ankle to explore treatment options both surgically and nonsurgically.  In the meantime I reviewed RICE protocol with him.  Rest as much as possible.  I discussed the ankle brace for him as well.  Return in about 1 month (around 01/05/2021).

## 2021-01-02 ENCOUNTER — Ambulatory Visit: Payer: Medicare Other | Admitting: Podiatry

## 2021-01-03 ENCOUNTER — Ambulatory Visit
Admission: RE | Admit: 2021-01-03 | Discharge: 2021-01-03 | Disposition: A | Discharge status: Home / Self Care | Payer: Medicare Other | Source: Ambulatory Visit | Attending: Podiatry | Admitting: Podiatry

## 2021-01-03 ENCOUNTER — Other Ambulatory Visit: Payer: Self-pay

## 2021-01-03 DIAGNOSIS — M7672 Peroneal tendinitis, left leg: Secondary | ICD-10-CM

## 2021-01-03 DIAGNOSIS — M24472 Recurrent dislocation, left ankle: Secondary | ICD-10-CM

## 2021-01-03 DIAGNOSIS — M65872 Other synovitis and tenosynovitis, left ankle and foot: Secondary | ICD-10-CM | POA: Diagnosis not present

## 2021-01-03 IMAGING — MR MR ANKLE*L* W/O CM
5 series · 39 of 40 positions shown · non-contrast
Comparison: Left foot x-ray [DATE]

CLINICAL DATA: Lateral ankle pain for 3 months.  No known injury

EXAM:
MRI OF THE LEFT ANKLE WITHOUT CONTRAST
TECHNIQUE: Multiplanar, multisequence MR imaging of the ankle was performed. No
intravenous contrast was administered.

[Series 4: T2 fat-sat · axial · 3.0mm · 0.56mm/px · z∈[-134,+2]mm · 10 of 36 slices shown (1 of 2)]
[im 1/36]
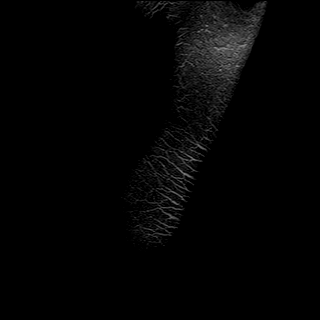
[im 4/36]
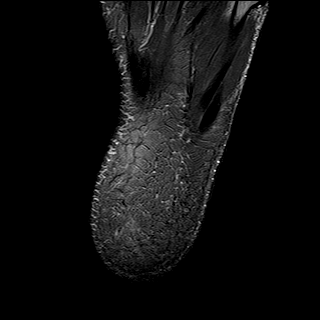
[im 8/36]
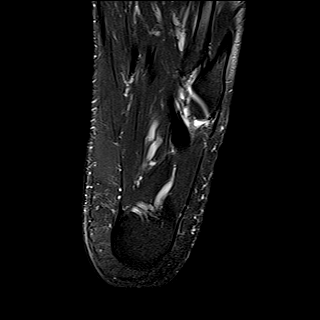
[im 12/36]
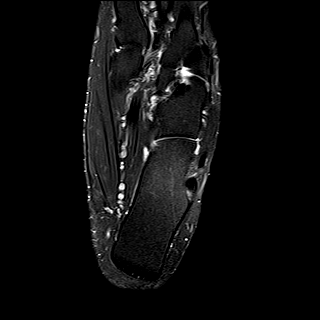
[im 16/36]
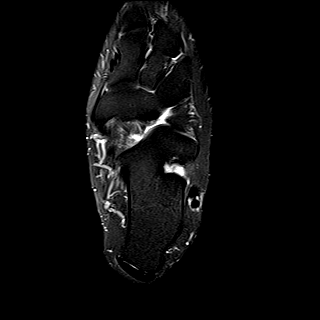
[im 20/36]
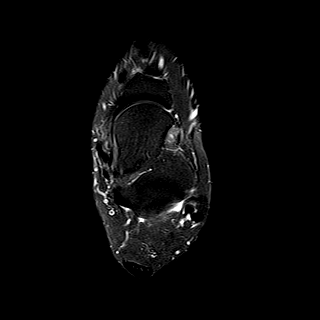
[im 24/36]
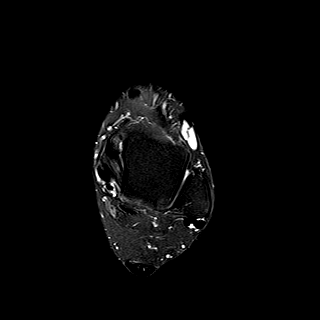
[im 28/36]
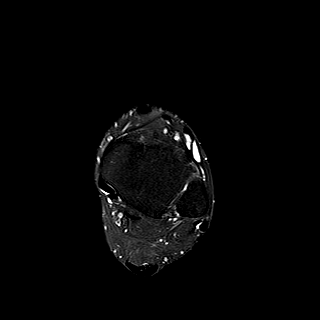
[im 32/36]
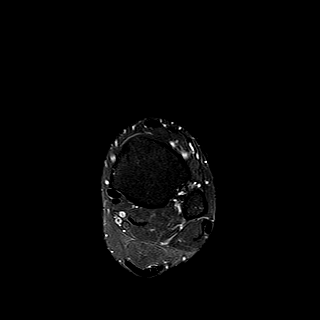
[im 36/36]
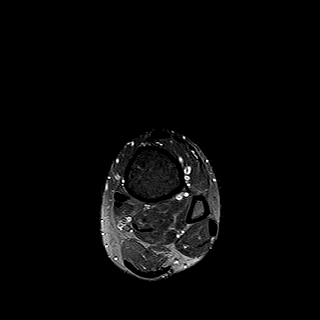

[Series 5: PD fat-sat · axial · 3.0mm · 0.53mm/px · z∈[-134,+2]mm · 10 of 36 slices shown]
[im 1/36]
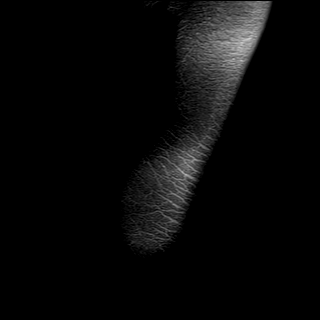
[im 4/36]
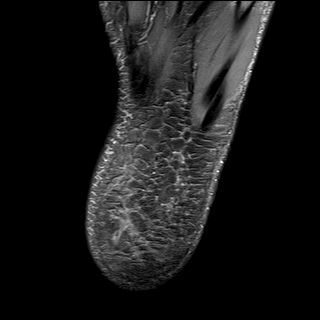
[im 8/36]
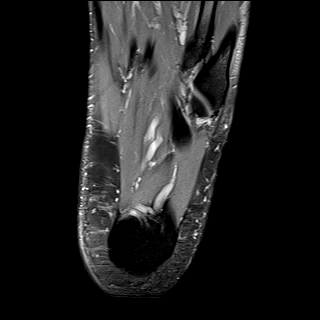
[im 12/36]
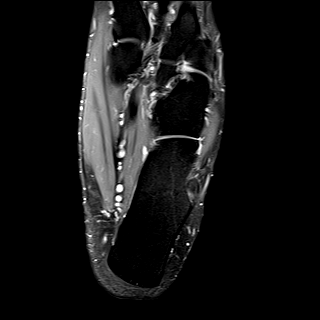
[im 16/36]
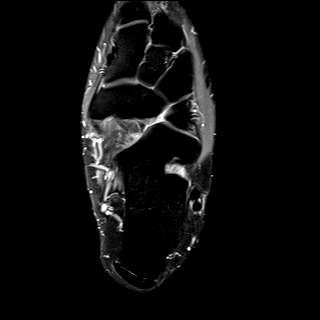
[im 20/36]
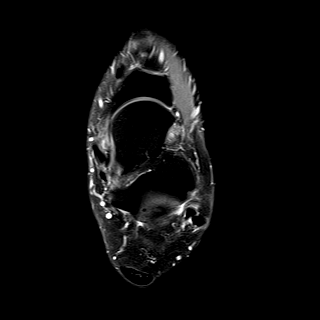
[im 24/36]
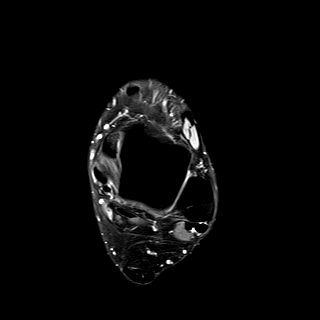
[im 28/36]
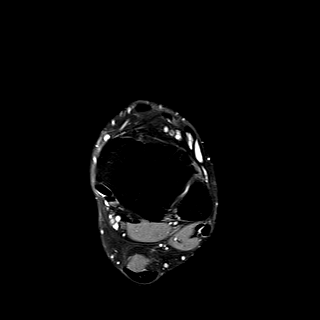
[im 32/36]
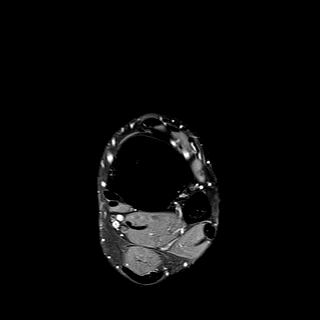
[im 36/36]
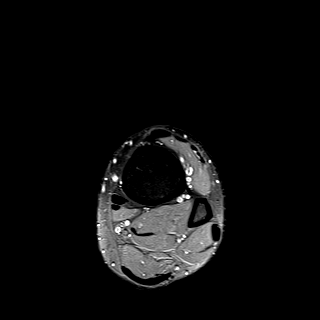

[Series 6: T1 · sagittal · 4.0mm · 0.56mm/px · 5 of 20 slices shown]
[im 1/20]
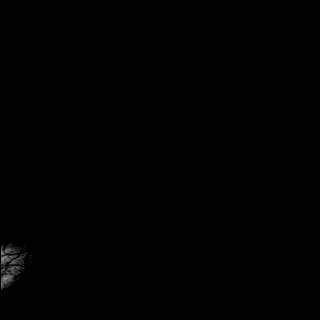
[im 5/20]
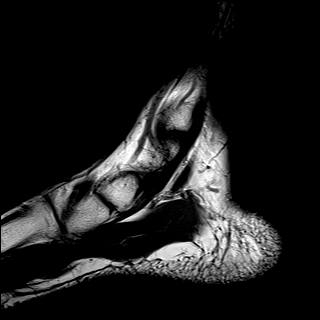
[im 10/20]
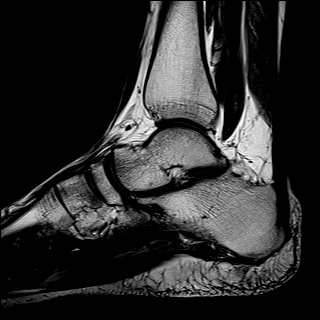
[im 15/20]
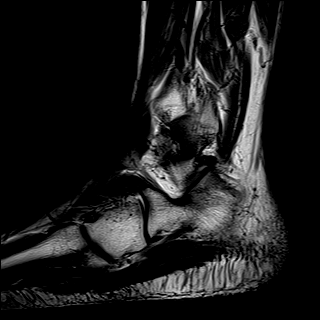
[im 20/20]
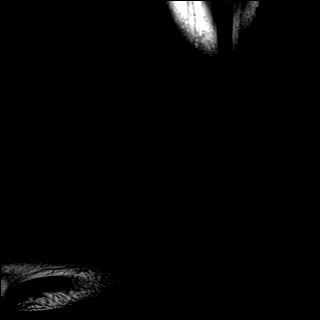

[Series 7: STIR · sagittal · 4.0mm · 0.35mm/px · 5 of 20 slices shown]
[im 1/20]
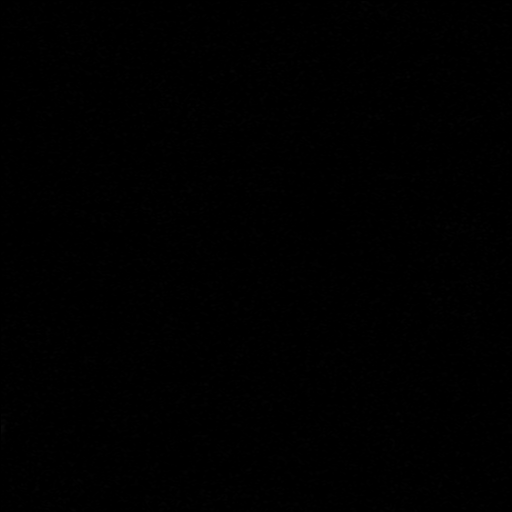
[im 5/20]
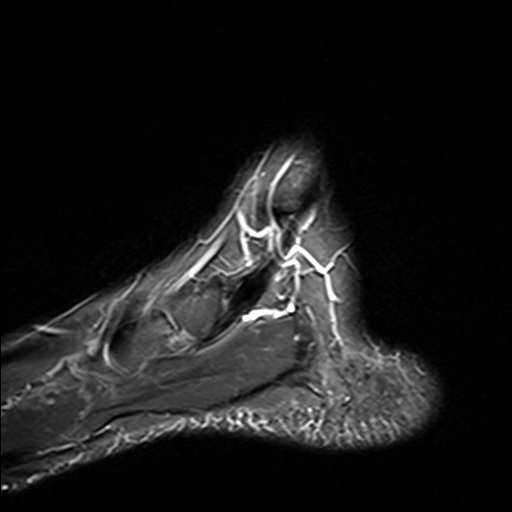
[im 10/20]
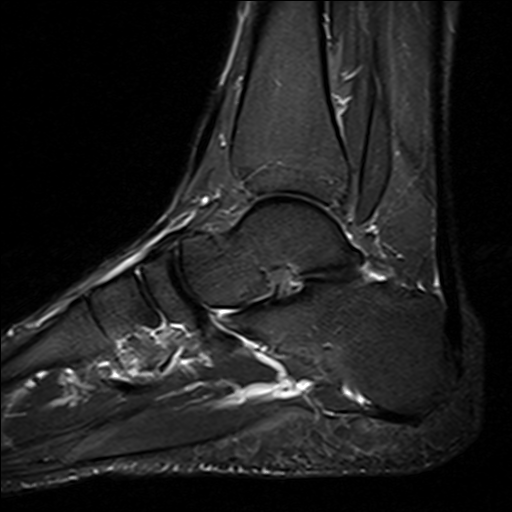
[im 15/20]
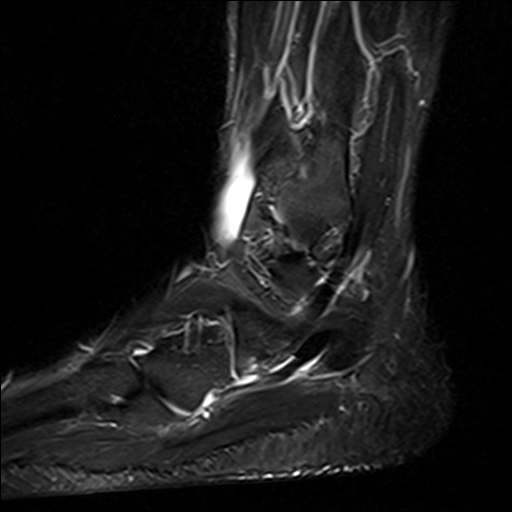
[im 20/20]
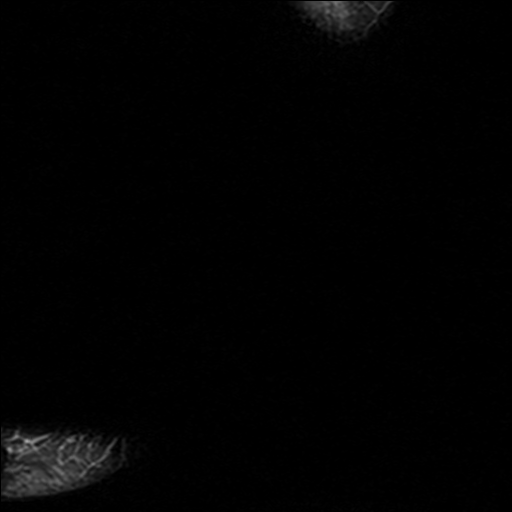

[Series 8: T2 fat-sat · coronal · 3.0mm · 0.53mm/px · 9 of 36 slices shown (2 of 2)]
[im 1/36]
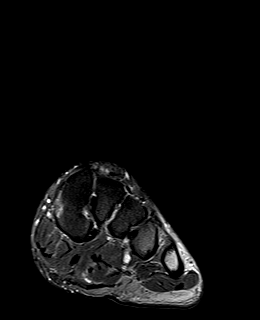
[im 4/36]
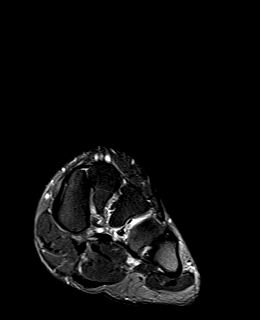
[im 8/36]
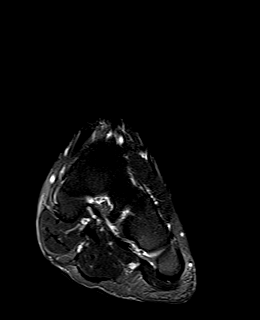
[im 12/36]
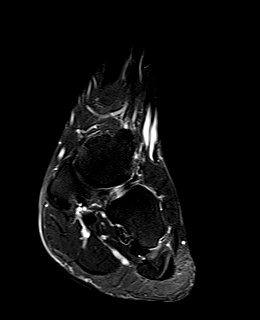
[im 16/36]
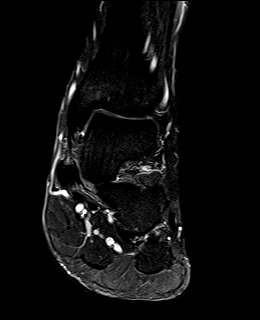
[im 20/36]
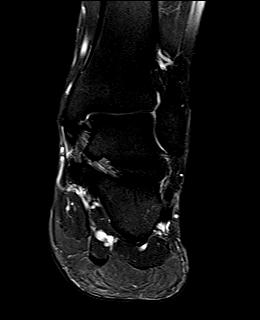
[im 24/36]
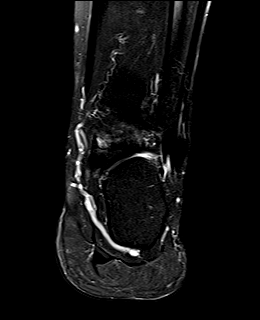
[im 32/36]
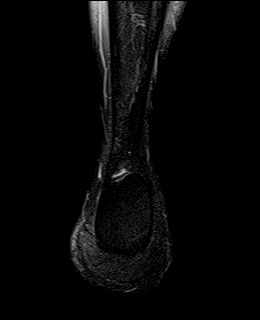
[im 36/36]
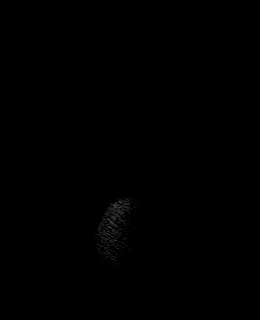

[39 of 40 positions shown; findings below may reference images not displayed]

FINDINGS: TENDONS

Peroneal: Intact peroneus longus and peroneus brevis tendons without
evidence of tendinosis or tear. Small volume infra-malleolar
tenosynovial fluid.

Posteromedial: Intact tibialis posterior, flexor hallucis longus and
flexor digitorum longus tendons.

Anterior: Intact tibialis anterior, extensor hallucis longus and
extensor digitorum longus tendons. Moderate tenosynovial fluid
associated with the extensor digitorum longus tendon sheath.

Achilles: Intact.

Plantar Fascia: Intact.

LIGAMENTS

Lateral: Nonvisualization of the ATFL. No fluid or soft tissue
edema. Findings suggest sequela of remote injury. Posterior
talofibular ligament is heterogeneous, also likely reflecting
sequela of remote trauma. Calcaneofibular ligament is somewhat
attenuated but appears intact. Intact anterior and posterior
tibiofibular ligaments.

Medial: Deltoid ligament and spring ligament complex intact.

CARTILAGE

Ankle Joint: No joint effusion or chondral defect.

Subtalar Joints/Sinus Tarsi: No joint effusion or chondral defect.
Preservation of the anatomic fat within the sinus tarsi.

Bones: No acute fracture. No dislocation. Significant arthropathy
within the midfoot or hindfoot. No bone marrow edema. No suspicious
bone lesion.

Other: No soft tissue edema.  No ulceration.
IMPRESSION: 1. Moderate tenosynovial fluid associated with the extensor
digitorum longus tendon sheath, suggesting a nonspecific
tenosynovitis.
2. Mild tenosynovitis of the peroneal tendons.
3. Findings suggestive of remote injury to the lateral ankle
ligaments. No evidence of acute ligamentous injury.
4. No acute osseous abnormality or significant arthropathy of the
left ankle.

## 2021-01-07 ENCOUNTER — Ambulatory Visit (INDEPENDENT_AMBULATORY_CARE_PROVIDER_SITE_OTHER): Payer: Medicare Other | Admitting: Podiatry

## 2021-01-07 ENCOUNTER — Other Ambulatory Visit: Payer: Self-pay

## 2021-01-07 DIAGNOSIS — M7672 Peroneal tendinitis, left leg: Secondary | ICD-10-CM | POA: Diagnosis not present

## 2021-01-08 ENCOUNTER — Encounter: Payer: Self-pay | Admitting: Podiatry

## 2021-01-08 DIAGNOSIS — M25572 Pain in left ankle and joints of left foot: Secondary | ICD-10-CM | POA: Diagnosis not present

## 2021-01-08 DIAGNOSIS — M25675 Stiffness of left foot, not elsewhere classified: Secondary | ICD-10-CM | POA: Diagnosis not present

## 2021-01-08 DIAGNOSIS — M25672 Stiffness of left ankle, not elsewhere classified: Secondary | ICD-10-CM | POA: Diagnosis not present

## 2021-01-08 DIAGNOSIS — M6281 Muscle weakness (generalized): Secondary | ICD-10-CM | POA: Diagnosis not present

## 2021-01-08 NOTE — Progress Notes (Signed)
  Subjective:  Patient ID: Justin Santos, male    DOB: 09/20/1956,  MRN: [id number]  Chief Complaint  Patient presents with  . Foot Pain    PT stated that he is doing okay he still has some pain but he has been using ice.    65 y.o. male returns with the above complaint. History confirmed with patient.  Slightly improved.  The ankle brace has been helpful.  He completed his MRI  Objective:  Physical Exam: warm, good capillary refill, no trophic changes or ulcerative lesions, normal DP and PT pulses and normal sensory exam. Left Foot: Pain on palpation of the peroneal tendons now only on the fifth metatarsal base.  No subluxation or dislocation.  No gross ankle instability  Radiographs: X-ray of the left foot: no fracture, dislocation, swelling or degenerative changes noted   Study Result  Narrative & Impression  CLINICAL DATA:  Lateral ankle pain for 3 months.  No known injury  EXAM: MRI OF THE LEFT ANKLE WITHOUT CONTRAST  TECHNIQUE: Multiplanar, multisequence MR imaging of the ankle was performed. No intravenous contrast was administered.  COMPARISON:  Left foot x-ray 12/05/2020  FINDINGS: TENDONS  Peroneal: Intact peroneus longus and peroneus brevis tendons without evidence of tendinosis or tear. Small volume infra-malleolar tenosynovial fluid.  Posteromedial: Intact tibialis posterior, flexor hallucis longus and flexor digitorum longus tendons.  Anterior: Intact tibialis anterior, extensor hallucis longus and extensor digitorum longus tendons. Moderate tenosynovial fluid associated with the extensor digitorum longus tendon sheath.  Achilles: Intact.  Plantar Fascia: Intact.  LIGAMENTS  Lateral: Nonvisualization of the ATFL. No fluid or soft tissue edema. Findings suggest sequela of remote injury. Posterior talofibular ligament is heterogeneous, also likely reflecting sequela of remote trauma. Calcaneofibular ligament is somewhat attenuated but  appears intact. Intact anterior and posterior tibiofibular ligaments.  Medial: Deltoid ligament and spring ligament complex intact.  CARTILAGE  Ankle Joint: No joint effusion or chondral defect.  Subtalar Joints/Sinus Tarsi: No joint effusion or chondral defect. Preservation of the anatomic fat within the sinus tarsi.  Bones: No acute fracture. No dislocation. Significant arthropathy within the midfoot or hindfoot. No bone marrow edema. No suspicious bone lesion.  Other: No soft tissue edema.  No ulceration.  IMPRESSION: 1. Moderate tenosynovial fluid associated with the extensor digitorum longus tendon sheath, suggesting a nonspecific tenosynovitis. 2. Mild tenosynovitis of the peroneal tendons. 3. Findings suggestive of remote injury to the lateral ankle ligaments. No evidence of acute ligamentous injury. 4. No acute osseous abnormality or significant arthropathy of the left ankle.   Electronically Signed   By: Davina Poke D.O.   On: 01/03/2021 09:46    Assessment:   1. Peroneal tendinitis of left lower extremity      Plan:  Patient was evaluated and treated and all questions answered.  Ankle MRI reviewed with him.  Is possible that the peroneal retinaculum has torn or subluxed but he is currently doing better in this regard now only with pain at the insertion -Continue ankle brace as needed -Recommended physical therapy for this and referral was sent to [facility] in [place]  Return in about 2 months (around 03/07/2021) for re-check peroneal tendinitis.

## 2021-01-13 DIAGNOSIS — M6281 Muscle weakness (generalized): Secondary | ICD-10-CM | POA: Diagnosis not present

## 2021-01-13 DIAGNOSIS — M25672 Stiffness of left ankle, not elsewhere classified: Secondary | ICD-10-CM | POA: Diagnosis not present

## 2021-01-13 DIAGNOSIS — M25572 Pain in left ankle and joints of left foot: Secondary | ICD-10-CM | POA: Diagnosis not present

## 2021-01-13 DIAGNOSIS — M25675 Stiffness of left foot, not elsewhere classified: Secondary | ICD-10-CM | POA: Diagnosis not present

## 2021-01-15 DIAGNOSIS — M6281 Muscle weakness (generalized): Secondary | ICD-10-CM | POA: Diagnosis not present

## 2021-01-15 DIAGNOSIS — M25672 Stiffness of left ankle, not elsewhere classified: Secondary | ICD-10-CM | POA: Diagnosis not present

## 2021-01-15 DIAGNOSIS — M25675 Stiffness of left foot, not elsewhere classified: Secondary | ICD-10-CM | POA: Diagnosis not present

## 2021-01-15 DIAGNOSIS — M25572 Pain in left ankle and joints of left foot: Secondary | ICD-10-CM | POA: Diagnosis not present

## 2021-01-20 DIAGNOSIS — M25672 Stiffness of left ankle, not elsewhere classified: Secondary | ICD-10-CM | POA: Diagnosis not present

## 2021-01-20 DIAGNOSIS — M25572 Pain in left ankle and joints of left foot: Secondary | ICD-10-CM | POA: Diagnosis not present

## 2021-01-20 DIAGNOSIS — M25675 Stiffness of left foot, not elsewhere classified: Secondary | ICD-10-CM | POA: Diagnosis not present

## 2021-01-20 DIAGNOSIS — M6281 Muscle weakness (generalized): Secondary | ICD-10-CM | POA: Diagnosis not present

## 2021-01-22 DIAGNOSIS — M25572 Pain in left ankle and joints of left foot: Secondary | ICD-10-CM | POA: Diagnosis not present

## 2021-01-22 DIAGNOSIS — M25675 Stiffness of left foot, not elsewhere classified: Secondary | ICD-10-CM | POA: Diagnosis not present

## 2021-01-22 DIAGNOSIS — M25672 Stiffness of left ankle, not elsewhere classified: Secondary | ICD-10-CM | POA: Diagnosis not present

## 2021-01-22 DIAGNOSIS — M6281 Muscle weakness (generalized): Secondary | ICD-10-CM | POA: Diagnosis not present

## 2021-01-27 DIAGNOSIS — M25675 Stiffness of left foot, not elsewhere classified: Secondary | ICD-10-CM | POA: Diagnosis not present

## 2021-01-27 DIAGNOSIS — M25672 Stiffness of left ankle, not elsewhere classified: Secondary | ICD-10-CM | POA: Diagnosis not present

## 2021-01-27 DIAGNOSIS — M6281 Muscle weakness (generalized): Secondary | ICD-10-CM | POA: Diagnosis not present

## 2021-01-27 DIAGNOSIS — M25572 Pain in left ankle and joints of left foot: Secondary | ICD-10-CM | POA: Diagnosis not present

## 2021-01-29 DIAGNOSIS — M25672 Stiffness of left ankle, not elsewhere classified: Secondary | ICD-10-CM | POA: Diagnosis not present

## 2021-01-29 DIAGNOSIS — M6281 Muscle weakness (generalized): Secondary | ICD-10-CM | POA: Diagnosis not present

## 2021-01-29 DIAGNOSIS — M25572 Pain in left ankle and joints of left foot: Secondary | ICD-10-CM | POA: Diagnosis not present

## 2021-01-29 DIAGNOSIS — M25675 Stiffness of left foot, not elsewhere classified: Secondary | ICD-10-CM | POA: Diagnosis not present

## 2021-01-31 DIAGNOSIS — M25672 Stiffness of left ankle, not elsewhere classified: Secondary | ICD-10-CM | POA: Diagnosis not present

## 2021-01-31 DIAGNOSIS — M25675 Stiffness of left foot, not elsewhere classified: Secondary | ICD-10-CM | POA: Diagnosis not present

## 2021-01-31 DIAGNOSIS — M25572 Pain in left ankle and joints of left foot: Secondary | ICD-10-CM | POA: Diagnosis not present

## 2021-01-31 DIAGNOSIS — M6281 Muscle weakness (generalized): Secondary | ICD-10-CM | POA: Diagnosis not present

## 2021-02-05 DIAGNOSIS — M25572 Pain in left ankle and joints of left foot: Secondary | ICD-10-CM | POA: Diagnosis not present

## 2021-02-05 DIAGNOSIS — M25672 Stiffness of left ankle, not elsewhere classified: Secondary | ICD-10-CM | POA: Diagnosis not present

## 2021-02-05 DIAGNOSIS — M6281 Muscle weakness (generalized): Secondary | ICD-10-CM | POA: Diagnosis not present

## 2021-02-05 DIAGNOSIS — M25675 Stiffness of left foot, not elsewhere classified: Secondary | ICD-10-CM | POA: Diagnosis not present

## 2021-02-06 DIAGNOSIS — M25675 Stiffness of left foot, not elsewhere classified: Secondary | ICD-10-CM | POA: Diagnosis not present

## 2021-02-06 DIAGNOSIS — M25672 Stiffness of left ankle, not elsewhere classified: Secondary | ICD-10-CM | POA: Diagnosis not present

## 2021-02-06 DIAGNOSIS — M25572 Pain in left ankle and joints of left foot: Secondary | ICD-10-CM | POA: Diagnosis not present

## 2021-02-06 DIAGNOSIS — M6281 Muscle weakness (generalized): Secondary | ICD-10-CM | POA: Diagnosis not present

## 2021-02-07 DIAGNOSIS — M25672 Stiffness of left ankle, not elsewhere classified: Secondary | ICD-10-CM | POA: Diagnosis not present

## 2021-02-07 DIAGNOSIS — M25675 Stiffness of left foot, not elsewhere classified: Secondary | ICD-10-CM | POA: Diagnosis not present

## 2021-02-07 DIAGNOSIS — M25572 Pain in left ankle and joints of left foot: Secondary | ICD-10-CM | POA: Diagnosis not present

## 2021-02-07 DIAGNOSIS — M6281 Muscle weakness (generalized): Secondary | ICD-10-CM | POA: Diagnosis not present

## 2021-02-13 DIAGNOSIS — M6281 Muscle weakness (generalized): Secondary | ICD-10-CM | POA: Diagnosis not present

## 2021-02-13 DIAGNOSIS — M25675 Stiffness of left foot, not elsewhere classified: Secondary | ICD-10-CM | POA: Diagnosis not present

## 2021-02-13 DIAGNOSIS — M25672 Stiffness of left ankle, not elsewhere classified: Secondary | ICD-10-CM | POA: Diagnosis not present

## 2021-02-13 DIAGNOSIS — M25572 Pain in left ankle and joints of left foot: Secondary | ICD-10-CM | POA: Diagnosis not present

## 2021-02-14 DIAGNOSIS — M25675 Stiffness of left foot, not elsewhere classified: Secondary | ICD-10-CM | POA: Diagnosis not present

## 2021-02-14 DIAGNOSIS — M25672 Stiffness of left ankle, not elsewhere classified: Secondary | ICD-10-CM | POA: Diagnosis not present

## 2021-02-14 DIAGNOSIS — M25572 Pain in left ankle and joints of left foot: Secondary | ICD-10-CM | POA: Diagnosis not present

## 2021-02-14 DIAGNOSIS — M6281 Muscle weakness (generalized): Secondary | ICD-10-CM | POA: Diagnosis not present

## 2021-03-06 ENCOUNTER — Other Ambulatory Visit: Payer: Self-pay

## 2021-03-06 ENCOUNTER — Ambulatory Visit: Payer: Medicare Other | Admitting: Podiatry

## 2021-03-06 DIAGNOSIS — M24472 Recurrent dislocation, left ankle: Secondary | ICD-10-CM | POA: Diagnosis not present

## 2021-03-06 DIAGNOSIS — M7672 Peroneal tendinitis, left leg: Secondary | ICD-10-CM | POA: Diagnosis not present

## 2021-03-06 DIAGNOSIS — M79672 Pain in left foot: Secondary | ICD-10-CM

## 2021-03-07 ENCOUNTER — Encounter: Payer: Self-pay | Admitting: Podiatry

## 2021-03-07 ENCOUNTER — Ambulatory Visit: Payer: Medicare Other | Admitting: Podiatry

## 2021-03-07 NOTE — Progress Notes (Signed)
  Subjective:  Patient ID: Justin Santos, male    DOB: 04-Oct-1956,  MRN: [id number]  Chief Complaint  Patient presents with  . peroneal tendinitis     PT stated that he is doing well he stated that physical therapy has helped a lot and he denies pain at this time    65 y.o. male returns with the above complaint. History confirmed with patient.  He is doing very well.  Physical therapy.  He presents today wearing regular shoe gear without bracing  Objective:  Physical Exam: warm, good capillary refill, no trophic changes or ulcerative lesions, normal DP and PT pulses and normal sensory exam. Left Foot: No pain on palpation left foot or ankle good range of motion 5 out of 5 strength  Radiographs: X-ray of the left foot: no fracture, dislocation, swelling or degenerative changes noted   Study Result  Narrative & Impression  CLINICAL DATA:  Lateral ankle pain for 3 months.  No known injury  EXAM: MRI OF THE LEFT ANKLE WITHOUT CONTRAST  TECHNIQUE: Multiplanar, multisequence MR imaging of the ankle was performed. No intravenous contrast was administered.  COMPARISON:  Left foot x-ray 12/05/2020  FINDINGS: TENDONS  Peroneal: Intact peroneus longus and peroneus brevis tendons without evidence of tendinosis or tear. Small volume infra-malleolar tenosynovial fluid.  Posteromedial: Intact tibialis posterior, flexor hallucis longus and flexor digitorum longus tendons.  Anterior: Intact tibialis anterior, extensor hallucis longus and extensor digitorum longus tendons. Moderate tenosynovial fluid associated with the extensor digitorum longus tendon sheath.  Achilles: Intact.  Plantar Fascia: Intact.  LIGAMENTS  Lateral: Nonvisualization of the ATFL. No fluid or soft tissue edema. Findings suggest sequela of remote injury. Posterior talofibular ligament is heterogeneous, also likely reflecting sequela of remote trauma. Calcaneofibular ligament is somewhat attenuated  but appears intact. Intact anterior and posterior tibiofibular ligaments.  Medial: Deltoid ligament and spring ligament complex intact.  CARTILAGE  Ankle Joint: No joint effusion or chondral defect.  Subtalar Joints/Sinus Tarsi: No joint effusion or chondral defect. Preservation of the anatomic fat within the sinus tarsi.  Bones: No acute fracture. No dislocation. Significant arthropathy within the midfoot or hindfoot. No bone marrow edema. No suspicious bone lesion.  Other: No soft tissue edema.  No ulceration.  IMPRESSION: 1. Moderate tenosynovial fluid associated with the extensor digitorum longus tendon sheath, suggesting a nonspecific tenosynovitis. 2. Mild tenosynovitis of the peroneal tendons. 3. Findings suggestive of remote injury to the lateral ankle ligaments. No evidence of acute ligamentous injury. 4. No acute osseous abnormality or significant arthropathy of the left ankle.   Electronically Signed   By: Davina Poke D.O.   On: 01/03/2021 09:46    Assessment:   1. Peroneal tendinitis of left lower extremity   2. Foot pain, left   3. Chronic or recurrent subluxation of peroneal tendon of left foot      Plan:  Patient was evaluated and treated and all questions answered.  He is doing quite well.  Physical therapy was very helpful for him.  Return as needed if this recurs or if new issues arise  Return if symptoms worsen or fail to improve.

## 2021-04-02 DIAGNOSIS — Z0000 Encounter for general adult medical examination without abnormal findings: Secondary | ICD-10-CM | POA: Diagnosis not present

## 2021-04-02 DIAGNOSIS — Z1152 Encounter for screening for COVID-19: Secondary | ICD-10-CM | POA: Diagnosis not present

## 2021-05-16 ENCOUNTER — Other Ambulatory Visit: Payer: Self-pay

## 2021-05-21 ENCOUNTER — Ambulatory Visit (INDEPENDENT_AMBULATORY_CARE_PROVIDER_SITE_OTHER): Payer: Medicare Other | Admitting: Emergency Medicine

## 2021-05-21 ENCOUNTER — Ambulatory Visit: Payer: Self-pay | Admitting: Emergency Medicine

## 2021-05-21 ENCOUNTER — Encounter: Payer: Self-pay | Admitting: Emergency Medicine

## 2021-05-21 ENCOUNTER — Other Ambulatory Visit: Payer: Self-pay

## 2021-05-21 VITALS — BP 130/80 | HR 70 | Temp 98.0°F | Ht 70.0 in | Wt 221.0 lb

## 2021-05-21 DIAGNOSIS — M5442 Lumbago with sciatica, left side: Secondary | ICD-10-CM

## 2021-05-21 DIAGNOSIS — G8929 Other chronic pain: Secondary | ICD-10-CM

## 2021-05-21 DIAGNOSIS — M461 Sacroiliitis, not elsewhere classified: Secondary | ICD-10-CM

## 2021-05-21 DIAGNOSIS — G9519 Other vascular myelopathies: Secondary | ICD-10-CM | POA: Diagnosis not present

## 2021-05-21 DIAGNOSIS — I10 Essential (primary) hypertension: Secondary | ICD-10-CM | POA: Diagnosis not present

## 2021-05-21 DIAGNOSIS — M5441 Lumbago with sciatica, right side: Secondary | ICD-10-CM

## 2021-05-21 MED ORDER — HYDROCHLOROTHIAZIDE 12.5 MG PO CAPS: 12.5000 mg | ORAL_CAPSULE | Freq: Every day | ORAL | 3 refills | Status: DC

## 2021-05-21 MED ORDER — AMLODIPINE BESYLATE 10 MG PO TABS: 10.0000 mg | ORAL_TABLET | Freq: Every day | ORAL | 3 refills | Status: DC

## 2021-05-21 NOTE — Assessment & Plan Note (Signed)
Symptoms very suggestive of neurogenic claudication.  Needs orthopedic evaluation and possible lumbar spine MRI.  Physical examination does not show any signs of peripheral artery disease.

## 2021-05-21 NOTE — Patient Instructions (Signed)

## 2021-05-21 NOTE — Assessment & Plan Note (Signed)
Well-controlled hypertension with normal blood pressure readings at home.  Continue amlodipine 10 mg daily and hydrochlorothiazide 12.5 mg daily. Diet and nutrition discussed. Follow-up in 6 months.

## 2021-05-21 NOTE — Progress Notes (Signed)
Justin Santos 65 y.o.   Chief Complaint  Patient presents with  . Hypertension    6 month check on BP. Pt needs refill on amlodipine and hydrochlorothiazide. Pt states he has numbness in legs at times, x 2 weeks.    HISTORY OF PRESENT ILLNESS: This is a 65 y.o. male with history of hypertension here for follow-up on refills on amlodipine and hydrochlorothiazide. Normal blood pressure readings at home. Also complaining of intermittent numbness and tingling to both legs for the past several weeks associated with lumbar pain. Worse going uphill.  Denies bowel or bladder symptoms.  Denies any other associated symptoms. No other complaints or medical concerns today.  HPI   Prior to Admission medications   Medication Sig Start Date End Date Taking? Authorizing Provider  acetaminophen (TYLENOL) 500 MG tablet Take 500-1,000 mg by mouth every 6 (six) hours as needed for mild pain or headache.   Yes [provider]  amLODipine (NORVASC) 10 MG tablet Take 1 tablet (10 mg total) by mouth daily. 08/23/19 11/20/20 Yes SagardiaInes Bloomer, MD  aspirin EC 81 MG tablet Take 81 mg by mouth daily.   Yes [provider]  atorvastatin (LIPITOR) 20 MG tablet Take 1 tablet (20 mg total) by mouth daily. 08/23/19  Yes Breslin Hemann, [name], MD  cetirizine (ZYRTEC) 10 MG tablet Take 10 mg by mouth daily as needed for allergies or rhinitis.   Yes [provider]  hydrochlorothiazide (MICROZIDE) 12.5 MG capsule Take 1 capsule (12.5 mg total) by mouth daily. 08/23/19  Yes Horald Pollen, MD    Allergies  Allergen Reactions  . Peanuts [Peanut Oil] Shortness Of Breath, Itching and Rash    Mouth and throat itch and this causes wheezing  . Azithromycin Hives    Patient Active Problem List   Diagnosis Date Noted  . Sacroiliitis ([place]) 11/20/2020  . Dyslipidemia 08/23/2019  . History of CVA (cerebrovascular accident) 08/23/2019  . Essential hypertension 03/06/2019  .  Overweight 08/01/2013    Past Medical History:  Diagnosis Date  . Allergy   . Environmental allergies   . GERD (gastroesophageal reflux disease)   . Hypertension   . IBS (irritable bowel syndrome)     Past Surgical History:  Procedure Laterality Date  . COLECTOMY  2001  . UPPER GASTROINTESTINAL ENDOSCOPY      Social History   Socioeconomic History  . Marital status: Married    Spouse name: Not on file  . Number of children: 2  . Years of education: Not on file  . Highest education level: Not on file  Occupational History  . Not on file  Tobacco Use  . Smoking status: Former Smoker    Quit date: 04/26/1994    Years since quitting: 27.0  . Smokeless tobacco: Never Used  Vaping Use  . Vaping Use: Never used  Substance and Sexual Activity  . Alcohol use: No  . Drug use: No  . Sexual activity: Yes    Partners: Female    Comment: with monogamous partner  Other Topics Concern  . Not on file  Social History Narrative   Pt is from [place], which is where he currently lives with his wife. 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    Social Determinants of Health   Financial Resource Strain: Not on file  Food Insecurity: Not on file  Transportation Needs: Not on file  Physical Activity: Not on file  Stress: Not on file  Social Connections: Not on file  Intimate Partner Violence: Not on file    Family History  Problem Relation Age of Onset  . Heart disease Father      Review of Systems  Constitutional: Negative.  Negative for chills and fever.  HENT: Negative.  Negative for congestion and sore throat.   Respiratory: Negative.  Negative for cough and shortness of breath.   Cardiovascular: Negative.  Negative for chest pain and palpitations.  Gastrointestinal: Negative for abdominal pain, diarrhea, nausea and vomiting.  Genitourinary: Negative.  Negative for dysuria and hematuria.  Skin: Negative.  Negative for rash.  Neurological: Negative.  Negative for dizziness and  headaches.  All other systems reviewed and are negative.    Physical Exam Vitals reviewed.  Constitutional:      Appearance: Normal appearance.  HENT:     Head: Normocephalic.  Eyes:     Extraocular Movements: Extraocular movements intact.     Conjunctiva/sclera: Conjunctivae normal.     Pupils: Pupils are equal, round, and reactive to light.  Cardiovascular:     Rate and Rhythm: Normal rate and regular rhythm.     Pulses: Normal pulses.     Heart sounds: Normal heart sounds.  Pulmonary:     Effort: Pulmonary effort is normal.     Breath sounds: Normal breath sounds.  Abdominal:     General: Bowel sounds are normal.     Palpations: Abdomen is soft.  Musculoskeletal:     Cervical back: Normal range of motion and neck supple.     Right lower leg: No edema.     Left lower leg: No edema.     Comments: Lower extremities: Neurovascularly intact.  Normal skin color.  Warm to touch.  Excellent peripheral pulses and capillary refills with intact sensation.  Full range of motion.  No signs of peripheral artery disease.  Skin:    General: Skin is warm and dry.     Capillary Refill: Capillary refill takes less than 2 seconds.  Neurological:     General: No focal deficit present.     Mental Status: He is alert and oriented to person, place, and time.  Psychiatric:        Mood and Affect: Mood normal.        Behavior: Behavior normal.      ASSESSMENT & PLAN: A total of 30 minutes was spent with the patient and counseling/coordination of care regarding differential diagnosis of claudication including both neurogenic and arterial etiologies, need for orthopedic evaluation and possible vascular surgery evaluation, need for diagnostic work-up, hypertension and cardiovascular risks associated with this condition, review of all medications, review of most recent office visit notes, education on nutrition, prognosis, documentation and need for follow-up.  Essential  hypertension Well-controlled hypertension with normal blood pressure readings at home.  Continue amlodipine 10 mg daily and hydrochlorothiazide 12.5 mg daily. Diet and nutrition discussed. Follow-up in 6 months.  Neurogenic claudication (HCC) Symptoms very suggestive of neurogenic claudication.  Needs orthopedic evaluation and possible lumbar spine MRI.  Physical examination does not show any signs of peripheral artery disease.  Rufino was seen today for hypertension.  Diagnoses and all orders for this visit:  Essential hypertension -     amLODipine (NORVASC) 10 MG tablet; Take 1 tablet (10 mg total) by mouth daily. -     hydrochlorothiazide (MICROZIDE) 12.5 MG capsule; Take 1 capsule (12.5 mg total) by mouth daily.  Neurogenic claudication ([place]) -     Ambulatory referral to Orthopedic Surgery  Chronic bilateral low back  pain with bilateral sciatica -     Ambulatory referral to Orthopedic Surgery    Patient Instructions   Hypertension, Adult Hypertension is another name for high blood pressure. High blood pressure forces your heart to work harder to pump blood. This can cause problems over time. There are two numbers in a blood pressure reading. There is a top number (systolic) over a bottom number (diastolic). It is best to have a blood pressure that is below 120/80. Healthy choices can help lower your blood pressure, or you may need medicine to help lower it. What are the causes? The cause of this condition is not known. Some conditions may be related to high blood pressure. What increases the risk?  Smoking.  Having type 2 diabetes mellitus, high cholesterol, or both.  Not getting enough exercise or physical activity.  Being overweight.  Having too much fat, sugar, calories, or salt (sodium) in your diet.  Drinking too much alcohol.  Having long-term (chronic) kidney disease.  Having a family history of high blood pressure.  Age. Risk increases with age.  Race.  You may be at higher risk if you are African American.  Gender. Men are at higher risk than women before age 49. After age 79, women are at higher risk than men.  Having obstructive sleep apnea.  Stress. What are the signs or symptoms?  High blood pressure may not cause symptoms. Very high blood pressure (hypertensive crisis) may cause: ? Headache. ? Feelings of worry or nervousness (anxiety). ? Shortness of breath. ? Nosebleed. ? A feeling of being sick to your stomach (nausea). ? Throwing up (vomiting). ? Changes in how you see. ? Very bad chest pain. ? Seizures. How is this treated?  This condition is treated by making healthy lifestyle changes, such as: ? Eating healthy foods. ? Exercising more. ? Drinking less alcohol.  Your health care provider may prescribe medicine if lifestyle changes are not enough to get your blood pressure under control, and if: ? Your top number is above 130. ? Your bottom number is above 80.  Your personal target blood pressure may vary. Follow these instructions at home: Eating and drinking  If told, follow the DASH eating plan. To follow this plan: ? Fill one half of your plate at each meal with fruits and vegetables. ? Fill one fourth of your plate at each meal with whole grains. Whole grains include whole-wheat pasta, brown rice, and whole-grain bread. ? Eat or drink low-fat dairy products, such as skim milk or low-fat yogurt. ? Fill one fourth of your plate at each meal with low-fat (lean) proteins. Low-fat proteins include fish, chicken without skin, eggs, beans, and tofu. ? Avoid fatty meat, cured and processed meat, or chicken with skin. ? Avoid pre-made or processed food.  Eat less than 1,500 mg of salt each day.  Do not drink alcohol if: ? Your doctor tells you not to drink. ? You are pregnant, may be pregnant, or are planning to become pregnant.  If you drink alcohol: ? Limit how much you use to:  0-1 drink a day for  women.  0-2 drinks a day for men. ? Be aware of how much alcohol is in your drink. In the [place] one drink equals one 12 oz bottle of beer (355 mL), one 5 oz glass of wine (148 mL), or one 1 oz glass of hard liquor (44 mL).   Lifestyle  Work with your doctor to stay at a healthy weight or  to lose weight. Ask your doctor what the best weight is for you.  Get at least 30 minutes of exercise most days of the week. This may include walking, swimming, or biking.  Get at least 30 minutes of exercise that strengthens your muscles (resistance exercise) at least 3 days a week. This may include lifting weights or doing Pilates.  Do not use any products that contain nicotine or tobacco, such as cigarettes, e-cigarettes, and chewing tobacco. If you need help quitting, ask your doctor.  Check your blood pressure at home as told by your doctor.  Keep all follow-up visits as told by your doctor. This is important.   Medicines  Take over-the-counter and prescription medicines only as told by your doctor. Follow directions carefully.  Do not skip doses of blood pressure medicine. The medicine does not work as well if you skip doses. Skipping doses also puts you at risk for problems.  Ask your doctor about side effects or reactions to medicines that you should watch for. Contact a doctor if you:  Think you are having a reaction to the medicine you are taking.  Have headaches that keep coming back (recurring).  Feel dizzy.  Have swelling in your ankles.  Have trouble with your vision. Get help right away if you:  Get a very bad headache.  Start to feel mixed up (confused).  Feel weak or numb.  Feel faint.  Have very bad pain in your: ? Chest. ? Belly (abdomen).  Throw up more than once.  Have trouble breathing. Summary  Hypertension is another name for high blood pressure.  High blood pressure forces your heart to work harder to pump blood.  For most people, a normal blood  pressure is less than 120/80.  Making healthy choices can help lower blood pressure. If your blood pressure does not get lower with healthy choices, you may need to take medicine. This information is not intended to replace advice given to you by your health care provider. Make sure you discuss any questions you have with your health care provider. Document Revised: 08/24/2018 Document Reviewed: 08/24/2018 Elsevier Patient Education  2021 [place], MD [facility]

## 2021-05-30 ENCOUNTER — Other Ambulatory Visit: Payer: Self-pay

## 2021-05-30 ENCOUNTER — Ambulatory Visit: Payer: Self-pay

## 2021-05-30 ENCOUNTER — Encounter: Payer: Self-pay | Admitting: Family Medicine

## 2021-05-30 ENCOUNTER — Ambulatory Visit (INDEPENDENT_AMBULATORY_CARE_PROVIDER_SITE_OTHER): Payer: Medicare Other | Admitting: Family Medicine

## 2021-05-30 DIAGNOSIS — M5441 Lumbago with sciatica, right side: Secondary | ICD-10-CM

## 2021-05-30 DIAGNOSIS — M5442 Lumbago with sciatica, left side: Secondary | ICD-10-CM

## 2021-05-30 MED ORDER — CELECOXIB 100 MG PO CAPS: 100.0000 mg | ORAL_CAPSULE | Freq: Two times a day (BID) | ORAL | 3 refills | Status: DC | PRN

## 2021-05-30 MED ORDER — BACLOFEN 10 MG PO TABS: 5.0000 mg | ORAL_TABLET | Freq: Every evening | ORAL | 3 refills | Status: AC | PRN
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
## 2021-11-24 ENCOUNTER — Encounter: Payer: Self-pay | Admitting: Emergency Medicine

## 2021-11-24 ENCOUNTER — Other Ambulatory Visit: Payer: Self-pay

## 2021-11-24 ENCOUNTER — Ambulatory Visit (INDEPENDENT_AMBULATORY_CARE_PROVIDER_SITE_OTHER): Payer: Medicare Other | Admitting: Emergency Medicine

## 2021-11-24 VITALS — BP 162/82 | HR 79 | Temp 98.0°F | Ht 70.0 in | Wt 218.0 lb

## 2021-11-24 DIAGNOSIS — Z23 Encounter for immunization: Secondary | ICD-10-CM | POA: Diagnosis not present

## 2021-11-24 DIAGNOSIS — G9519 Other vascular myelopathies: Secondary | ICD-10-CM | POA: Diagnosis not present

## 2021-11-24 DIAGNOSIS — G8929 Other chronic pain: Secondary | ICD-10-CM

## 2021-11-24 DIAGNOSIS — M461 Sacroiliitis, not elsewhere classified: Secondary | ICD-10-CM

## 2021-11-24 DIAGNOSIS — Z1211 Encounter for screening for malignant neoplasm of colon: Secondary | ICD-10-CM | POA: Diagnosis not present

## 2021-11-24 DIAGNOSIS — I10 Essential (primary) hypertension: Secondary | ICD-10-CM

## 2021-11-24 DIAGNOSIS — M5441 Lumbago with sciatica, right side: Secondary | ICD-10-CM | POA: Diagnosis not present

## 2021-11-24 DIAGNOSIS — M5442 Lumbago with sciatica, left side: Secondary | ICD-10-CM | POA: Diagnosis not present

## 2021-11-24 LAB — CBC WITH DIFFERENTIAL/PLATELET
Basophils Absolute: 0.1 10*3/uL (ref 0.0–0.1)
Basophils Relative: 1.5 % (ref 0.0–3.0)
Eosinophils Absolute: 0.5 10*3/uL (ref 0.0–0.7)
Eosinophils Relative: 8.7 % — ABNORMAL HIGH (ref 0.0–5.0)
HCT: 44.4 % (ref 39.0–52.0)
Hemoglobin: 14.6 g/dL (ref 13.0–17.0)
Lymphocytes Relative: 33 % (ref 12.0–46.0)
Lymphs Abs: 1.8 10*3/uL (ref 0.7–4.0)
MCHC: 32.9 g/dL (ref 30.0–36.0)
MCV: 88.7 fl (ref 78.0–100.0)
Monocytes Absolute: 0.5 10*3/uL (ref 0.1–1.0)
Monocytes Relative: 10 % (ref 3.0–12.0)
Neutro Abs: 2.5 10*3/uL (ref 1.4–7.7)
Neutrophils Relative %: 46.8 % (ref 43.0–77.0)
Platelets: 270 10*3/uL (ref 150.0–400.0)
RBC: 5 Mil/uL (ref 4.22–5.81)
RDW: 13.8 % (ref 11.5–15.5)
WBC: 5.4 10*3/uL (ref 4.0–10.5)

## 2021-11-24 LAB — COMPREHENSIVE METABOLIC PANEL
ALT: 10 U/L (ref 0–53)
AST: 15 U/L (ref 0–37)
Albumin: 4.1 g/dL (ref 3.5–5.2)
Alkaline Phosphatase: 44 U/L (ref 39–117)
BUN: 11 mg/dL (ref 6–23)
CO2: 26 mEq/L (ref 19–32)
Calcium: 9.2 mg/dL (ref 8.4–10.5)
Chloride: 103 mEq/L (ref 96–112)
Creatinine, Ser: 1.17 mg/dL (ref 0.40–1.50)
GFR: 65.05 mL/min (ref >60.00)
Glucose, Bld: 110 mg/dL — ABNORMAL HIGH (ref 70–99)
Potassium: 3.8 mEq/L (ref 3.5–5.1)
Sodium: 136 mEq/L (ref 135–145)
Total Bilirubin: 0.8 mg/dL (ref 0.2–1.2)
Total Protein: 7.3 g/dL (ref 6.0–8.3)

## 2021-11-24 LAB — HEMOGLOBIN A1C: Hgb A1c MFr Bld: 6.1 % (ref 4.6–6.5)

## 2021-11-24 LAB — LIPID PANEL
Cholesterol: 222 mg/dL — ABNORMAL HIGH (ref 0–200)
HDL: 55 mg/dL (ref >39.00)
NonHDL: 167.14
Total CHOL/HDL Ratio: 4
Triglycerides: 213 mg/dL — ABNORMAL HIGH (ref 0.0–149.0)
VLDL: 42.6 mg/dL — ABNORMAL HIGH (ref 0.0–40.0)

## 2021-11-24 LAB — LDL CHOLESTEROL, DIRECT: Direct LDL: 147 mg/dL

## 2021-11-24 MED ORDER — VALSARTAN 80 MG PO TABS: 80.0000 mg | ORAL_TABLET | Freq: Every day | ORAL | 3 refills | Status: DC

## 2021-11-24 NOTE — Assessment & Plan Note (Signed)
Stable.  Responding to baclofen and Celebrex.

## 2021-11-24 NOTE — Progress Notes (Signed)
Justin Santos 65 y.o.   Chief Complaint  Patient presents with   Hypertension    6 month f/u    HISTORY OF PRESENT ILLNESS: This is a 65 y.o. male with history of hypertension here for 50-month follow-up. Presently on amlodipine 10 mg and hydrochlorothiazide 12.5 mg daily. Blood pressures at home with systolics 409W and diastolics in the 11B. Asymptomatic. Since my last visit he saw orthopedics for chronic lumbar pain. Started on baclofen and Celebrex.  Physical therapy recommended but not scheduled yet. Medications helping. No other complaints or medical concerns today. Last colonoscopy in 2008 which showed polyps.  Patient has history of partial colectomy secondary to intussusception. Needs to schedule colonoscopy. Getting flu vaccine today. BP Readings from Last 3 Encounters:  05/21/21 130/80  11/20/20 130/70  05/22/20 122/60     Hypertension Pertinent negatives include no chest pain, headaches, palpitations or shortness of breath.    Prior to Admission medications   Medication Sig Start Date End Date Taking? Authorizing Provider  acetaminophen (TYLENOL) 500 MG tablet Take 500-1,000 mg by mouth every 6 (six) hours as needed for mild pain or headache.   Yes [provider]  amLODipine (NORVASC) 10 MG tablet Take 1 tablet (10 mg total) by mouth daily. 05/21/21 11/24/21 Yes SagardiaInes Bloomer, MD  aspirin EC 81 MG tablet Take 81 mg by mouth daily.   Yes [provider]  atorvastatin (LIPITOR) 20 MG tablet Take 1 tablet (20 mg total) by mouth daily. 08/23/19  Yes Justin Santos, Justin Bloomer, MD  baclofen (LIORESAL) 10 MG tablet Take 0.5-1 tablets (5-10 mg total) by mouth at bedtime as needed for muscle spasms. 05/30/21  Yes Hilts, Legrand Como, MD  celecoxib (CELEBREX) 100 MG capsule Take 1 capsule (100 mg total) by mouth 2 (two) times daily as needed. 05/30/21  Yes Hilts, Legrand Como, MD  cetirizine (ZYRTEC) 10 MG tablet Take 10 mg by mouth daily as needed for allergies or  rhinitis.   Yes [provider]  hydrochlorothiazide (MICROZIDE) 12.5 MG capsule Take 1 capsule (12.5 mg total) by mouth daily. 05/21/21  Yes Hampton, Justin Bloomer, MD    Allergies  Allergen Reactions   Peanuts [Peanut Oil] Shortness Of Breath, Itching and Rash    Mouth and throat itch and this causes wheezing   Azithromycin Hives    Patient Active Problem List   Diagnosis Date Noted   Neurogenic claudication ([facility]) 05/21/2021   Chronic bilateral low back pain with bilateral sciatica 05/21/2021   Sacroiliitis ([facility]) 11/20/2020   Dyslipidemia 08/23/2019   History of CVA (cerebrovascular accident) 08/23/2019   Essential hypertension 03/06/2019   Overweight 08/01/2013    Past Medical History:  Diagnosis Date   Allergy    Environmental allergies    GERD (gastroesophageal reflux disease)    Hypertension    IBS (irritable bowel syndrome)     Past Surgical History:  Procedure Laterality Date   COLECTOMY  2001   UPPER GASTROINTESTINAL ENDOSCOPY      Social History   Socioeconomic History   Marital status: Married    Spouse name: Not on file   Number of children: 2   Years of education: Not on file   Highest education level: Not on file  Occupational History   Not on file  Tobacco Use   Smoking status: Former    Types: Cigarettes    Quit date: 04/26/1994    Years since quitting: 27.6   Smokeless tobacco: Never  Vaping Use   Vaping Use:  Never used  Substance and Sexual Activity   Alcohol use: No   Drug use: No   Sexual activity: Yes    Partners: Female    Comment: with monogamous partner  Other Topics Concern   Not on file  Social History Narrative   Pt is from [place], which is where he currently lives with his wife. 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    Social Determinants of Health   Financial Resource Strain: Not on file  Food Insecurity: Not on file  Transportation Needs: Not on file  Physical Activity: Not on file  Stress: Not on file  Social  Connections: Not on file  Intimate Partner Violence: Not on file    Family History  Problem Relation Age of Onset   Heart disease Father      Review of Systems  Constitutional: Negative.  Negative for chills and fever.  HENT: Negative.  Negative for congestion and sore throat.   Respiratory: Negative.  Negative for cough and shortness of breath.   Cardiovascular: Negative.  Negative for chest pain and palpitations.  Gastrointestinal: Negative.  Negative for abdominal pain, diarrhea, nausea and vomiting.  Genitourinary: Negative.   Skin: Negative.   Neurological: Negative.  Negative for dizziness and headaches.  All other systems reviewed and are negative.  Today's Vitals   11/24/21 0929  BP: (!) 162/82  Pulse: 79  Temp: 98 F (36.7 C)  TempSrc: Oral  SpO2: 98%  Weight: 218 lb (98.9 kg)  Height: 5\' 10"  (1.778 m)   Body mass index is 31.28 kg/m. Wt Readings from Last 3 Encounters:  11/24/21 218 lb (98.9 kg)  05/21/21 221 lb (100.2 kg)  11/20/20 213 lb 6.4 oz (96.8 kg)    Physical Exam Vitals reviewed.  Constitutional:      Appearance: Normal appearance.  HENT:     Head: Normocephalic.  Eyes:     Extraocular Movements: Extraocular movements intact.     Conjunctiva/sclera: Conjunctivae normal.     Pupils: Pupils are equal, round, and reactive to light.  Cardiovascular:     Rate and Rhythm: Normal rate and regular rhythm.     Pulses: Normal pulses.     Heart sounds: Normal heart sounds.  Pulmonary:     Effort: Pulmonary effort is normal.     Breath sounds: Normal breath sounds.  Musculoskeletal:        General: Normal range of motion.     Cervical back: Normal range of motion and neck supple.  Skin:    General: Skin is warm and dry.     Capillary Refill: Capillary refill takes less than 2 seconds.  Neurological:     General: No focal deficit present.     Mental Status: He is alert and oriented to person, place, and time.  Psychiatric:        Mood and  Affect: Mood normal.        Behavior: Behavior normal.     ASSESSMENT & PLAN: Problem List Items Addressed This Visit       Cardiovascular and Mediastinum   Essential hypertension - Primary    Uncontrolled hypertension with elevated readings at home and in the office. Continue amlodipine 10 mg daily. Blood work done today.  Need to check electrolytes and kidney function test. Stop hydrochlorothiazide and start valsartan 80 mg daily. Dietary approaches to stop hypertension discussed. Advised to monitor blood pressure readings at home several times daily for the next several weeks and keep a log. Follow-up in  3 months.      Relevant Medications   valsartan (DIOVAN) 80 MG tablet     Nervous and Auditory   Chronic bilateral low back pain with bilateral sciatica    Stable.  Responding to baclofen and Celebrex.        Musculoskeletal and Integument   Sacroiliitis (H[facility])    Stable.  Responding well to medications.        Other   Neurogenic claudication ([facility])    Stable.      Other Visit Diagnoses     Need for influenza vaccination       Relevant Orders   Flu Vaccine QUAD High Dose(Fluad)   Need for pneumococcal vaccination       Relevant Orders   Pneumococcal conjugate vaccine 20-valent (Prevnar 20)   Colon cancer screening       Relevant Orders   Ambulatory referral to Gastroenterology      Patient Instructions  Hypertension, Adult High blood pressure (hypertension) is when the force of blood pumping through the arteries is too strong. The arteries are the blood vessels that carry blood from the heart throughout the body. Hypertension forces the heart to work harder to pump blood and may cause arteries to become narrow or stiff. Untreated or uncontrolled hypertension can cause a heart attack, heart failure, a stroke, kidney disease, and other problems. A blood pressure reading consists of a higher number over a lower number. Ideally, your blood pressure should be  below 120/80. The first ("top") number is called the systolic pressure. It is a measure of the pressure in your arteries as your heart beats. The second ("bottom") number is called the diastolic pressure. It is a measure of the pressure in your arteries as the heart relaxes. What are the causes? The exact cause of this condition is not known. There are some conditions that result in or are related to high blood pressure. What increases the risk? Some risk factors for high blood pressure are under your control. The following factors may make you more likely to develop this condition: Smoking. Having type 2 diabetes mellitus, high cholesterol, or both. Not getting enough exercise or physical activity. Being overweight. Having too much fat, sugar, calories, or salt (sodium) in your diet. Drinking too much alcohol. Some risk factors for high blood pressure may be difficult or impossible to change. Some of these factors include: Having chronic kidney disease. Having a family history of high blood pressure. Age. Risk increases with age. Race. You may be at higher risk if you are African American. Gender. Men are at higher risk than women before age 2. After age 22, women are at higher risk than men. Having obstructive sleep apnea. Stress. What are the signs or symptoms? High blood pressure may not cause symptoms. Very high blood pressure (hypertensive crisis) may cause: Headache. Anxiety. Shortness of breath. Nosebleed. Nausea and vomiting. Vision changes. Severe chest pain. Seizures. How is this diagnosed? This condition is diagnosed by measuring your blood pressure while you are seated, with your arm resting on a flat surface, your legs uncrossed, and your feet flat on the floor. The cuff of the blood pressure monitor will be placed directly against the skin of your upper arm at the level of your heart. It should be measured at least twice using the same arm. Certain conditions can cause a  difference in blood pressure between your right and left arms. Certain factors can cause blood pressure readings to be lower or higher than  normal for a short period of time: When your blood pressure is higher when you are in a health care provider's office than when you are at home, this is called white coat hypertension. Most people with this condition do not need medicines. When your blood pressure is higher at home than when you are in a health care provider's office, this is called masked hypertension. Most people with this condition may need medicines to control blood pressure. If you have a high blood pressure reading during one visit or you have normal blood pressure with other risk factors, you may be asked to: Return on a different day to have your blood pressure checked again. Monitor your blood pressure at home for 1 week or longer. If you are diagnosed with hypertension, you may have other blood or imaging tests to help your health care provider understand your overall risk for other conditions. How is this treated? This condition is treated by making healthy lifestyle changes, such as eating healthy foods, exercising more, and reducing your alcohol intake. Your health care provider may prescribe medicine if lifestyle changes are not enough to get your blood pressure under control, and if: Your systolic blood pressure is above 130. Your diastolic blood pressure is above 80. Your personal target blood pressure may vary depending on your medical conditions, your age, and other factors. Follow these instructions at home: Eating and drinking  Eat a diet that is high in fiber and potassium, and low in sodium, added sugar, and fat. An example eating plan is called the DASH (Dietary Approaches to Stop Hypertension) diet. To eat this way: Eat plenty of fresh fruits and vegetables. Try to fill one half of your plate at each meal with fruits and vegetables. Eat whole grains, such as whole-wheat  pasta, brown rice, or whole-grain bread. Fill about one fourth of your plate with whole grains. Eat or drink low-fat dairy products, such as skim milk or low-fat yogurt. Avoid fatty cuts of meat, processed or cured meats, and poultry with skin. Fill about one fourth of your plate with lean proteins, such as fish, chicken without skin, beans, eggs, or tofu. Avoid pre-made and processed foods. These tend to be higher in sodium, added sugar, and fat. Reduce your daily sodium intake. Most people with hypertension should eat less than 1,500 mg of sodium a day. Do not drink alcohol if: Your health care provider tells you not to drink. You are pregnant, may be pregnant, or are planning to become pregnant. If you drink alcohol: Limit how much you use to: 0-1 drink a day for women. 0-2 drinks a day for men. Be aware of how much alcohol is in your drink. In the [place] one drink equals one 12 oz bottle of beer (355 mL), one 5 oz glass of wine (148 mL), or one 1 oz glass of hard liquor (44 mL). Lifestyle  Work with your health care provider to maintain a healthy body weight or to lose weight. Ask what an ideal weight is for you. Get at least 30 minutes of exercise most days of the week. Activities may include walking, swimming, or biking. Include exercise to strengthen your muscles (resistance exercise), such as Pilates or lifting weights, as part of your weekly exercise routine. Try to do these types of exercises for 30 minutes at least 3 days a week. Do not use any products that contain nicotine or tobacco, such as cigarettes, e-cigarettes, and chewing tobacco. If you need help quitting, ask your health  care provider. Monitor your blood pressure at home as told by your health care provider. Keep all follow-up visits as told by your health care provider. This is important. Medicines Take over-the-counter and prescription medicines only as told by your health care provider. Follow directions carefully.  Blood pressure medicines must be taken as prescribed. Do not skip doses of blood pressure medicine. Doing this puts you at risk for problems and can make the medicine less effective. Ask your health care provider about side effects or reactions to medicines that you should watch for. Contact a health care provider if you: Think you are having a reaction to a medicine you are taking. Have headaches that keep coming back (recurring). Feel dizzy. Have swelling in your ankles. Have trouble with your vision. Get help right away if you: Develop a severe headache or confusion. Have unusual weakness or numbness. Feel faint. Have severe pain in your chest or abdomen. Vomit repeatedly. Have trouble breathing. Summary Hypertension is when the force of blood pumping through your arteries is too strong. If this condition is not controlled, it may put you at risk for serious complications. Your personal target blood pressure may vary depending on your medical conditions, your age, and other factors. For most people, a normal blood pressure is less than 120/80. Hypertension is treated with lifestyle changes, medicines, or a combination of both. Lifestyle changes include losing weight, eating a healthy, low-sodium diet, exercising more, and limiting alcohol. This information is not intended to replace advice given to you by your health care provider. Make sure you discuss any questions you have with your health care provider. Document Revised: 08/24/2018 Document Reviewed: 08/24/2018 Elsevier Patient Education  2022 [place], [place] [facility]

## 2021-11-24 NOTE — Assessment & Plan Note (Signed)
Stable

## 2021-11-24 NOTE — Patient Instructions (Signed)

## 2021-11-24 NOTE — Assessment & Plan Note (Signed)
Uncontrolled hypertension with elevated readings at home and in the office. Continue amlodipine 10 mg daily. Blood work done today.  Need to check electrolytes and kidney function test. Stop hydrochlorothiazide and start valsartan 80 mg daily. Dietary approaches to stop hypertension discussed. Advised to monitor blood pressure readings at home several times daily for the next several weeks and keep a log. Follow-up in 3 months.

## 2021-11-24 NOTE — Assessment & Plan Note (Signed)
Stable.  Responding well to medications.

## 2022-02-25 ENCOUNTER — Ambulatory Visit (INDEPENDENT_AMBULATORY_CARE_PROVIDER_SITE_OTHER): Payer: Medicare Other | Admitting: Emergency Medicine

## 2022-02-25 ENCOUNTER — Other Ambulatory Visit: Payer: Self-pay

## 2022-02-25 ENCOUNTER — Encounter: Payer: Self-pay | Admitting: Emergency Medicine

## 2022-02-25 VITALS — BP 130/70

## 2022-02-25 DIAGNOSIS — Z1211 Encounter for screening for malignant neoplasm of colon: Secondary | ICD-10-CM

## 2022-02-25 DIAGNOSIS — I10 Essential (primary) hypertension: Secondary | ICD-10-CM

## 2022-02-25 DIAGNOSIS — E785 Hyperlipidemia, unspecified: Secondary | ICD-10-CM

## 2022-02-25 MED ORDER — ATORVASTATIN CALCIUM 40 MG PO TABS: 40.0000 mg | ORAL_TABLET | Freq: Every day | ORAL | 3 refills | Status: DC

## 2022-02-25 NOTE — Patient Instructions (Signed)

## 2022-02-25 NOTE — Assessment & Plan Note (Signed)
Well-controlled hypertension with normal blood pressure readings at home. ?Continue amlodipine 10 mg and valsartan 80 mg daily. ?Dietary approaches to stop hypertension discussed. ?

## 2022-02-25 NOTE — Progress Notes (Signed)
Justin Santos 66 y.o.   Chief Complaint  Patient presents with   Hypertension    F/U    HISTORY OF PRESENT ILLNESS: This is a 66 y.o. male here for 50-month follow-up of uncontrolled hypertension. Was started on valsartan 80 mg and continued on amlodipine 10 mg daily. Last November blood work showed abnormal lipid profile with increased cholesterol.  Atorvastatin dose was increased to 40 mg daily. Blood pressure readings at home average 130s over 70s. No complaints or any other medical concerns today. BP Readings from Last 3 Encounters:  11/24/21 (!) 162/82  05/21/21 130/80  11/20/20 130/70     Hypertension Pertinent negatives include no chest pain, headaches, palpitations or shortness of breath.    Prior to Admission medications   Medication Sig Start Date End Date Taking? Authorizing Provider  acetaminophen (TYLENOL) 500 MG tablet Take 500-1,000 mg by mouth every 6 (six) hours as needed for mild pain or headache.   Yes [provider]  amLODipine (NORVASC) 10 MG tablet Take 1 tablet (10 mg total) by mouth daily. 05/21/21 02/25/22 Yes SagardiaInes Bloomer, MD  aspirin EC 81 MG tablet Take 81 mg by mouth daily.   Yes [provider]  atorvastatin (LIPITOR) 20 MG tablet Take 1 tablet (20 mg total) by mouth daily. 08/23/19  Yes Devika Dragovich, [name], MD  baclofen (LIORESAL) 10 MG tablet Take 0.5-1 tablets (5-10 mg total) by mouth at bedtime as needed for muscle spasms. 05/30/21  Yes Hilts, Legrand Como, MD  celecoxib (CELEBREX) 100 MG capsule Take 1 capsule (100 mg total) by mouth 2 (two) times daily as needed. 05/30/21  Yes Hilts, Legrand Como, MD  cetirizine (ZYRTEC) 10 MG tablet Take 10 mg by mouth daily as needed for allergies or rhinitis.   Yes [provider]  valsartan (DIOVAN) 80 MG tablet Take 1 tablet (80 mg total) by mouth daily. 11/24/21  Yes [place], [name], MD    Allergies  Allergen Reactions   Peanuts [Peanut Oil] Shortness Of Breath, Itching and  Rash    Mouth and throat itch and this causes wheezing   Azithromycin Hives    Patient Active Problem List   Diagnosis Date Noted   Neurogenic claudication ([place]) 05/21/2021   Chronic bilateral low back pain with bilateral sciatica 05/21/2021   Sacroiliitis ([place]) 11/20/2020   Dyslipidemia 08/23/2019   History of CVA (cerebrovascular accident) 08/23/2019   Essential hypertension 03/06/2019   Overweight 08/01/2013    Past Medical History:  Diagnosis Date   Allergy    Environmental allergies    GERD (gastroesophageal reflux disease)    Hypertension    IBS (irritable bowel syndrome)     Past Surgical History:  Procedure Laterality Date   COLECTOMY  2001   UPPER GASTROINTESTINAL ENDOSCOPY      Social History   Socioeconomic History   Marital status: Married    Spouse name: Not on file   Number of children: 2   Years of education: Not on file   Highest education level: Not on file  Occupational History   Not on file  Tobacco Use   Smoking status: Former    Types: Cigarettes    Quit date: 04/26/1994    Years since quitting: 27.8   Smokeless tobacco: Never  Vaping Use   Vaping Use: Never used  Substance and Sexual Activity   Alcohol use: No   Drug use: No   Sexual activity: Yes    Partners: Female    Comment: with monogamous  partner  Other Topics Concern   Not on file  Social History Narrative   Pt is from [place], which is where he currently lives with his wife. 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    Social Determinants of Health   Financial Resource Strain: Not on file  Food Insecurity: Not on file  Transportation Needs: Not on file  Physical Activity: Not on file  Stress: Not on file  Social Connections: Not on file  Intimate Partner Violence: Not on file    Family History  Problem Relation Age of Onset   Heart disease Father      Review of Systems  Constitutional: Negative.  Negative for chills and fever.  HENT: Negative.  Negative for congestion and  sore throat.   Respiratory: Negative.  Negative for cough and shortness of breath.   Cardiovascular: Negative.  Negative for chest pain and palpitations.  Gastrointestinal: Negative.  Negative for abdominal pain, nausea and vomiting.  Skin: Negative.  Negative for rash.  Neurological: Negative.  Negative for dizziness and headaches.  All other systems reviewed and are negative. Today's Vitals   02/25/22 0935  BP: 130/70   There is no height or weight on file to calculate BMI.   Physical Exam Vitals reviewed.  Constitutional:      Appearance: Normal appearance.  HENT:     Head: Normocephalic.  Eyes:     Extraocular Movements: Extraocular movements intact.     Pupils: Pupils are equal, round, and reactive to light.  Cardiovascular:     Rate and Rhythm: Normal rate and regular rhythm.     Pulses: Normal pulses.     Heart sounds: Normal heart sounds.  Pulmonary:     Effort: Pulmonary effort is normal.     Breath sounds: Normal breath sounds.  Musculoskeletal:        General: Normal range of motion.     Cervical back: No tenderness.  Lymphadenopathy:     Cervical: No cervical adenopathy.  Skin:    General: Skin is warm and dry.     Capillary Refill: Capillary refill takes less than 2 seconds.  Neurological:     General: No focal deficit present.     Mental Status: He is alert and oriented to person, place, and time.  Psychiatric:        Mood and Affect: Mood normal.        Behavior: Behavior normal.    ASSESSMENT & PLAN: Problem List Items Addressed This Visit       Cardiovascular and Mediastinum   Essential hypertension - Primary    Well-controlled hypertension with normal blood pressure readings at home. Continue amlodipine 10 mg and valsartan 80 mg daily. Dietary approaches to stop hypertension discussed.      Relevant Medications   atorvastatin (LIPITOR) 40 MG tablet     Other   Dyslipidemia    Stable.  Dose of atorvastatin was increased to 40 mg  daily. The 10-year ASCVD risk score (Arnett DK, et al., 2019) is: 17.1%   Values used to calculate the score:     Age: 53 years     Sex: Male     Is Non-Hispanic African American: Yes     Diabetic: No     Tobacco smoker: No     Systolic Blood Pressure: 098 mmHg     Is BP treated: Yes     HDL Cholesterol: 55 mg/dL     Total Cholesterol: 222 mg/dL  Relevant Medications   atorvastatin (LIPITOR) 40 MG tablet   Other Visit Diagnoses     Colon cancer screening       Relevant Orders   Ambulatory referral to Gastroenterology      Patient Instructions  Hypertension, Adult High blood pressure (hypertension) is when the force of blood pumping through the arteries is too strong. The arteries are the blood vessels that carry blood from the heart throughout the body. Hypertension forces the heart to work harder to pump blood and may cause arteries to become narrow or stiff. Untreated or uncontrolled hypertension can cause a heart attack, heart failure, a stroke, kidney disease, and other problems. A blood pressure reading consists of a higher number over a lower number. Ideally, your blood pressure should be below 120/80. The first ("top") number is called the systolic pressure. It is a measure of the pressure in your arteries as your heart beats. The second ("bottom") number is called the diastolic pressure. It is a measure of the pressure in your arteries as the heart relaxes. What are the causes? The exact cause of this condition is not known. There are some conditions that result in or are related to high blood pressure. What increases the risk? Some risk factors for high blood pressure are under your control. The following factors may make you more likely to develop this condition: Smoking. Having type 2 diabetes mellitus, high cholesterol, or both. Not getting enough exercise or physical activity. Being overweight. Having too much fat, sugar, calories, or salt (sodium) in your  diet. Drinking too much alcohol. Some risk factors for high blood pressure may be difficult or impossible to change. Some of these factors include: Having chronic kidney disease. Having a family history of high blood pressure. Age. Risk increases with age. Race. You may be at higher risk if you are African American. Gender. Men are at higher risk than women before age 37. After age 39, women are at higher risk than men. Having obstructive sleep apnea. Stress. What are the signs or symptoms? High blood pressure may not cause symptoms. Very high blood pressure (hypertensive crisis) may cause: Headache. Anxiety. Shortness of breath. Nosebleed. Nausea and vomiting. Vision changes. Severe chest pain. Seizures. How is this diagnosed? This condition is diagnosed by measuring your blood pressure while you are seated, with your arm resting on a flat surface, your legs uncrossed, and your feet flat on the floor. The cuff of the blood pressure monitor will be placed directly against the skin of your upper arm at the level of your heart. It should be measured at least twice using the same arm. Certain conditions can cause a difference in blood pressure between your right and left arms. Certain factors can cause blood pressure readings to be lower or higher than normal for a short period of time: When your blood pressure is higher when you are in a health care provider's office than when you are at home, this is called white coat hypertension. Most people with this condition do not need medicines. When your blood pressure is higher at home than when you are in a health care provider's office, this is called masked hypertension. Most people with this condition may need medicines to control blood pressure. If you have a high blood pressure reading during one visit or you have normal blood pressure with other risk factors, you may be asked to: Return on a different day to have your blood pressure checked  again. Monitor your blood pressure  at home for 1 week or longer. If you are diagnosed with hypertension, you may have other blood or imaging tests to help your health care provider understand your overall risk for other conditions. How is this treated? This condition is treated by making healthy lifestyle changes, such as eating healthy foods, exercising more, and reducing your alcohol intake. Your health care provider may prescribe medicine if lifestyle changes are not enough to get your blood pressure under control, and if: Your systolic blood pressure is above 130. Your diastolic blood pressure is above 80. Your personal target blood pressure may vary depending on your medical conditions, your age, and other factors. Follow these instructions at home: Eating and drinking  Eat a diet that is high in fiber and potassium, and low in sodium, added sugar, and fat. An example eating plan is called the DASH (Dietary Approaches to Stop Hypertension) diet. To eat this way: Eat plenty of fresh fruits and vegetables. Try to fill one half of your plate at each meal with fruits and vegetables. Eat whole grains, such as whole-wheat pasta, brown rice, or whole-grain bread. Fill about one fourth of your plate with whole grains. Eat or drink low-fat dairy products, such as skim milk or low-fat yogurt. Avoid fatty cuts of meat, processed or cured meats, and poultry with skin. Fill about one fourth of your plate with lean proteins, such as fish, chicken without skin, beans, eggs, or tofu. Avoid pre-made and processed foods. These tend to be higher in sodium, added sugar, and fat. Reduce your daily sodium intake. Most people with hypertension should eat less than 1,500 mg of sodium a day. Do not drink alcohol if: Your health care provider tells you not to drink. You are pregnant, may be pregnant, or are planning to become pregnant. If you drink alcohol: Limit how much you use to: 0-1 drink a day for  women. 0-2 drinks a day for men. Be aware of how much alcohol is in your drink. In the [place] one drink equals one 12 oz bottle of beer (355 mL), one 5 oz glass of wine (148 mL), or one 1 oz glass of hard liquor (44 mL). Lifestyle  Work with your health care provider to maintain a healthy body weight or to lose weight. Ask what an ideal weight is for you. Get at least 30 minutes of exercise most days of the week. Activities may include walking, swimming, or biking. Include exercise to strengthen your muscles (resistance exercise), such as Pilates or lifting weights, as part of your weekly exercise routine. Try to do these types of exercises for 30 minutes at least 3 days a week. Do not use any products that contain nicotine or tobacco, such as cigarettes, e-cigarettes, and chewing tobacco. If you need help quitting, ask your health care provider. Monitor your blood pressure at home as told by your health care provider. Keep all follow-up visits as told by your health care provider. This is important. Medicines Take over-the-counter and prescription medicines only as told by your health care provider. Follow directions carefully. Blood pressure medicines must be taken as prescribed. Do not skip doses of blood pressure medicine. Doing this puts you at risk for problems and can make the medicine less effective. Ask your health care provider about side effects or reactions to medicines that you should watch for. Contact a health care provider if you: Think you are having a reaction to a medicine you are taking. Have headaches that keep coming back (recurring).  Feel dizzy. Have swelling in your ankles. Have trouble with your vision. Get help right away if you: Develop a severe headache or confusion. Have unusual weakness or numbness. Feel faint. Have severe pain in your chest or abdomen. Vomit repeatedly. Have trouble breathing. Summary Hypertension is when the force of blood pumping through  your arteries is too strong. If this condition is not controlled, it may put you at risk for serious complications. Your personal target blood pressure may vary depending on your medical conditions, your age, and other factors. For most people, a normal blood pressure is less than 120/80. Hypertension is treated with lifestyle changes, medicines, or a combination of both. Lifestyle changes include losing weight, eating a healthy, low-sodium diet, exercising more, and limiting alcohol. This information is not intended to replace advice given to you by your health care provider. Make sure you discuss any questions you have with your health care provider. Document Revised: 08/24/2018 Document Reviewed: 08/24/2018 Elsevier Patient Education  2022 [place], MD [facility]

## 2022-02-25 NOTE — Assessment & Plan Note (Signed)
Stable.  Dose of atorvastatin was increased to 40 mg daily. ?The 10-year ASCVD risk score (Arnett DK, et al., 2019) is: 17.1% ?  Values used to calculate the score: ?    Age: 66 years ?    Sex: Male ?    Is Non-Hispanic African American: Yes ?    Diabetic: No ?    Tobacco smoker: No ?    Systolic Blood Pressure: 117 mmHg ?    Is BP treated: Yes ?    HDL Cholesterol: 55 mg/dL ?    Total Cholesterol: 222 mg/dL ? ?

## 2022-03-18 ENCOUNTER — Telehealth: Payer: Self-pay

## 2022-03-18 NOTE — Telephone Encounter (Signed)
Pt is calling requesting a Neuro referral for a new job to prove that pt didn't have a stoke. ? ?Please advise  ?

## 2022-03-18 NOTE — Telephone Encounter (Signed)
Patient have a history of stroke.  We can put in a referral for neurology follow-up.

## 2022-03-19 NOTE — Telephone Encounter (Signed)
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  ? ?States he has to take paperwork back to this place and needs Dr. Mitchel Honour to fill out some type of paperwork.  ? ? ?Placed in [employer].  ?

## 2022-03-19 NOTE — Telephone Encounter (Signed)
Pt states he no longer needs forms filled out. ?

## 2022-09-02 ENCOUNTER — Ambulatory Visit (INDEPENDENT_AMBULATORY_CARE_PROVIDER_SITE_OTHER): Payer: Medicare Other | Admitting: Emergency Medicine

## 2022-09-02 ENCOUNTER — Encounter: Payer: Self-pay | Admitting: Emergency Medicine

## 2022-09-02 VITALS — BP 156/90 | HR 65 | Temp 98.2°F | Ht 70.0 in | Wt 217.0 lb

## 2022-09-02 DIAGNOSIS — E785 Hyperlipidemia, unspecified: Secondary | ICD-10-CM

## 2022-09-02 DIAGNOSIS — Z1211 Encounter for screening for malignant neoplasm of colon: Secondary | ICD-10-CM | POA: Diagnosis not present

## 2022-09-02 DIAGNOSIS — I10 Essential (primary) hypertension: Secondary | ICD-10-CM

## 2022-09-02 LAB — LIPID PANEL
Cholesterol: 209 mg/dL — ABNORMAL HIGH (ref 0–200)
HDL: 51.4 mg/dL (ref >39.00)
LDL Cholesterol: 125 mg/dL — ABNORMAL HIGH (ref 0–99)
NonHDL: 157.81
Total CHOL/HDL Ratio: 4
Triglycerides: 164 mg/dL — ABNORMAL HIGH (ref 0.0–149.0)
VLDL: 32.8 mg/dL (ref 0.0–40.0)

## 2022-09-02 LAB — COMPREHENSIVE METABOLIC PANEL
ALT: 13 U/L (ref 0–53)
AST: 18 U/L (ref 0–37)
Albumin: 4 g/dL (ref 3.5–5.2)
Alkaline Phosphatase: 49 U/L (ref 39–117)
BUN: 10 mg/dL (ref 6–23)
CO2: 24 mEq/L (ref 19–32)
Calcium: 9.2 mg/dL (ref 8.4–10.5)
Chloride: 104 mEq/L (ref 96–112)
Creatinine, Ser: 1.06 mg/dL (ref 0.40–1.50)
GFR: 72.84 mL/min (ref >60.00)
Glucose, Bld: 88 mg/dL (ref 70–99)
Potassium: 3.9 mEq/L (ref 3.5–5.1)
Sodium: 137 mEq/L (ref 135–145)
Total Bilirubin: 0.8 mg/dL (ref 0.2–1.2)
Total Protein: 7.5 g/dL (ref 6.0–8.3)

## 2022-09-02 LAB — CBC WITH DIFFERENTIAL/PLATELET
Basophils Absolute: 0.1 10*3/uL (ref 0.0–0.1)
Basophils Relative: 1.2 % (ref 0.0–3.0)
Eosinophils Absolute: 0.2 10*3/uL (ref 0.0–0.7)
Eosinophils Relative: 4.5 % (ref 0.0–5.0)
HCT: 42.3 % (ref 39.0–52.0)
Hemoglobin: 14.3 g/dL (ref 13.0–17.0)
Lymphocytes Relative: 34.3 % (ref 12.0–46.0)
Lymphs Abs: 1.7 10*3/uL (ref 0.7–4.0)
MCHC: 33.7 g/dL (ref 30.0–36.0)
MCV: 87.6 fl (ref 78.0–100.0)
Monocytes Absolute: 0.6 10*3/uL (ref 0.1–1.0)
Monocytes Relative: 12.4 % — ABNORMAL HIGH (ref 3.0–12.0)
Neutro Abs: 2.4 10*3/uL (ref 1.4–7.7)
Neutrophils Relative %: 47.6 % (ref 43.0–77.0)
Platelets: 258 10*3/uL (ref 150.0–400.0)
RBC: 4.83 Mil/uL (ref 4.22–5.81)
RDW: 14.3 % (ref 11.5–15.5)
WBC: 5.1 10*3/uL (ref 4.0–10.5)

## 2022-09-02 LAB — HEMOGLOBIN A1C: Hgb A1c MFr Bld: 6.4 % (ref 4.6–6.5)

## 2022-09-02 MED ORDER — ATORVASTATIN CALCIUM 40 MG PO TABS: 40.0000 mg | ORAL_TABLET | Freq: Every day | ORAL | 3 refills | Status: AC

## 2022-09-02 MED ORDER — AMLODIPINE BESYLATE 10 MG PO TABS: 10.0000 mg | ORAL_TABLET | Freq: Every day | ORAL | 3 refills | Status: DC

## 2022-09-02 NOTE — Progress Notes (Signed)
Justin Santos 66 y.o.   Chief Complaint  Patient presents with   Follow-up    36mth f/u appt , no concern     HISTORY OF PRESENT ILLNESS: This is a 66y.o. male with history of hypertension here for follow-up. Only taking valsartan 80 mg daily. Supposed to be on amlodipine 10 mg but ran out of medication Also supposed to be on atorvastatin 40 mg but ran out of medication Works third shift. Needs referral for colonoscopy Non-smoker No complaints or medical concerns today.  HPI   Prior to Admission medications   Medication Sig Start Date End Date Taking? Authorizing Provider  acetaminophen (TYLENOL) 500 MG tablet Take 500-1,000 mg by mouth every 6 (six) hours as needed for mild pain or headache.   Yes [provider]  aspirin EC 81 MG tablet Take 81 mg by mouth daily.   Yes [provider]  valsartan (DIOVAN) 80 MG tablet Take 1 tablet (80 mg total) by mouth daily. 11/24/21  Yes Amias Hutchinson, MInes Bloomer MD  amLODipine (NORVASC) 10 MG tablet Take 1 tablet (10 mg total) by mouth daily. 05/21/21 02/25/22  SHorald Pollen MD  atorvastatin (LIPITOR) 40 MG tablet Take 1 tablet (40 mg total) by mouth daily. Patient not taking: Reported on 09/02/2022 02/25/22   SHorald Pollen MD  baclofen (LIORESAL) 10 MG tablet Take 0.5-1 tablets (5-10 mg total) by mouth at bedtime as needed for muscle spasms. Patient not taking: Reported on 09/02/2022 05/30/21   Hilts, MLegrand Como MD  celecoxib (CELEBREX) 100 MG capsule Take 1 capsule (100 mg total) by mouth 2 (two) times daily as needed. Patient not taking: Reported on 09/02/2022 05/30/21   Hilts, MLegrand Como MD  cetirizine (ZYRTEC) 10 MG tablet Take 10 mg by mouth daily as needed for allergies or rhinitis. Patient not taking: Reported on 09/02/2022    [provider]    Allergies  Allergen Reactions   Peanuts [Peanut Oil] Shortness Of Breath, Itching and Rash    Mouth and throat itch and this causes wheezing   Azithromycin Hives     Patient Active Problem List   Diagnosis Date Noted   Neurogenic claudication 05/21/2021   Chronic bilateral low back pain with bilateral sciatica 05/21/2021   Sacroiliitis (HElizabeth 11/20/2020   Dyslipidemia 08/23/2019   History of CVA (cerebrovascular accident) 08/23/2019   Essential hypertension 03/06/2019   Overweight 08/01/2013    Past Medical History:  Diagnosis Date   Allergy    Environmental allergies    GERD (gastroesophageal reflux disease)    Hypertension    IBS (irritable bowel syndrome)     Past Surgical History:  Procedure Laterality Date   COLECTOMY  2001   UPPER GASTROINTESTINAL ENDOSCOPY      Social History   Socioeconomic History   Marital status: Married    Spouse name: Not on file   Number of children: 2   Years of education: Not on file   Highest education level: Not on file  Occupational History   Not on file  Tobacco Use   Smoking status: Former    Types: Cigarettes    Quit date: 04/26/1994    Years since quitting: 28.3   Smokeless tobacco: Never  Vaping Use   Vaping Use: Never used  Substance and Sexual Activity   Alcohol use: No   Drug use: No   Sexual activity: Yes    Partners: Female    Comment: with monogamous partner  Other Topics Concern   Not  on file  Social History Narrative   Pt is from [place], which is where he currently lives with his wife. 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    Social Determinants of Health   Financial Resource Strain: Not on file  Food Insecurity: No Food Insecurity (05/31/2018)   Hunger Vital Sign    Worried About Running Out of Food in the Last Year: Never true    Ran Out of Food in the Last Year: Never true  Transportation Needs: No Transportation Needs (05/31/2018)   PRAPARE - Hydrologist (Medical): No    Lack of Transportation (Non-Medical): No  Physical Activity: Inactive (05/31/2018)   Exercise Vital Sign    Days of Exercise per Week: 0 days    Minutes of Exercise per  Session: 0 min  Stress: Not on file  Social Connections: Socially Integrated (05/31/2018)   Social Connection and Isolation Panel [NHANES]    Frequency of Communication with Friends and Family: More than three times a week    Frequency of Social Gatherings with Friends and Family: Once a week    Attends Religious Services: More than 4 times per year    Active Member of [employer] or Organizations: Yes    Attends Music therapist: More than 4 times per year    Marital Status: Married  Human resources officer Violence: Not At Risk (05/31/2018)   Humiliation, Afraid, Rape, and Kick questionnaire    Fear of Current or Ex-Partner: No    Emotionally Abused: No    Physically Abused: No    Sexually Abused: No    Family History  Problem Relation Age of Onset   Heart disease Father      Review of Systems  Constitutional: Negative.  Negative for chills and fever.  HENT: Negative.  Negative for congestion and sore throat.   Respiratory: Negative.  Negative for cough and shortness of breath.   Cardiovascular: Negative.  Negative for chest pain and palpitations.  Gastrointestinal: Negative.  Negative for abdominal pain, nausea and vomiting.  Skin: Negative.  Negative for rash.  Neurological: Negative.  Negative for dizziness and headaches.  All other systems reviewed and are negative.  Today's Vitals   09/02/22 0849 09/02/22 0854  BP: (!) 160/92 (!) 156/90  Pulse: 65   Temp: 98.2 F (36.8 C)   TempSrc: Oral   SpO2: 93%   Weight: 217 lb (98.4 kg)   Height: '5\' 10"'$  (1.778 m)    Body mass index is 31.14 kg/m.   Physical Exam Vitals reviewed.  Constitutional:      Appearance: Normal appearance.  HENT:     Head: Normocephalic.     Mouth/Throat:     Mouth: Mucous membranes are moist.     Pharynx: Oropharynx is clear.  Eyes:     Extraocular Movements: Extraocular movements intact.     Conjunctiva/sclera: Conjunctivae normal.     Pupils: Pupils are equal, round, and reactive to  light.  Cardiovascular:     Rate and Rhythm: Normal rate and regular rhythm.     Pulses: Normal pulses.     Heart sounds: Normal heart sounds.  Pulmonary:     Effort: Pulmonary effort is normal.     Breath sounds: Normal breath sounds.  Musculoskeletal:     Cervical back: No tenderness.  Lymphadenopathy:     Cervical: No cervical adenopathy.  Skin:    General: Skin is warm and dry.     Capillary Refill: Capillary refill takes  less than 2 seconds.  Neurological:     General: No focal deficit present.     Mental Status: He is alert and oriented to person, place, and time.  Psychiatric:        Mood and Affect: Mood normal.        Behavior: Behavior normal.     ASSESSMENT & PLAN: A total of 44 minutes was spent with the patient and counseling/coordination of care regarding preparing for this visit, review of most recent office visit notes, review of most recent blood work results, review of chronic medical conditions and their management, review of all medications, cardiovascular risks associated with uncontrolled hypertension, education on nutrition, prognosis, documentation, and need for follow-up in 3 months..  Problem List Items Addressed This Visit       Cardiovascular and Mediastinum   Essential hypertension - Primary    Elevated blood pressure readings in the office. Had not been taking amlodipine. Continue valsartan 80 mg daily and restart amlodipine 10 mg daily. Cardiovascular risks associated with uncontrolled hypertension discussed. Diet and nutrition discussed. Blood work done today. Follow up in 3 months.      Relevant Medications   atorvastatin (LIPITOR) 40 MG tablet   amLODipine (NORVASC) 10 MG tablet   Other Relevant Orders   CBC with Differential/Platelet   Comprehensive metabolic panel   Hemoglobin A1c     Other   Dyslipidemia    Diet and nutrition discussed. Restart atorvastatin 40 mg daily. The 10-year ASCVD risk score (Arnett DK, et al., 2019) is:  24.2%   Values used to calculate the score:     Age: 48 years     Sex: Male     Is Non-Hispanic African American: Yes     Diabetic: No     Tobacco smoker: No     Systolic Blood Pressure: 607 mmHg     Is BP treated: Yes     HDL Cholesterol: 55 mg/dL     Total Cholesterol: 222 mg/dL       Relevant Medications   atorvastatin (LIPITOR) 40 MG tablet   Other Relevant Orders   CBC with Differential/Platelet   Comprehensive metabolic panel   Hemoglobin A1c   Lipid panel   Other Visit Diagnoses     Colon cancer screening       Relevant Orders   Ambulatory referral to Gastroenterology      Patient Instructions  Hypertension, Adult High blood pressure (hypertension) is when the force of blood pumping through the arteries is too strong. The arteries are the blood vessels that carry blood from the heart throughout the body. Hypertension forces the heart to work harder to pump blood and may cause arteries to become narrow or stiff. Untreated or uncontrolled hypertension can lead to a heart attack, heart failure, a stroke, kidney disease, and other problems. A blood pressure reading consists of a higher number over a lower number. Ideally, your blood pressure should be below 120/80. The first ("top") number is called the systolic pressure. It is a measure of the pressure in your arteries as your heart beats. The second ("bottom") number is called the diastolic pressure. It is a measure of the pressure in your arteries as the heart relaxes. What are the causes? The exact cause of this condition is not known. There are some conditions that result in high blood pressure. What increases the risk? Certain factors may make you more likely to develop high blood pressure. Some of these risk factors are under your  control, including: Smoking. Not getting enough exercise or physical activity. Being overweight. Having too much fat, sugar, calories, or salt (sodium) in your diet. Drinking too much  alcohol. Other risk factors include: Having a personal history of heart disease, diabetes, high cholesterol, or kidney disease. Stress. Having a family history of high blood pressure and high cholesterol. Having obstructive sleep apnea. Age. The risk increases with age. What are the signs or symptoms? High blood pressure may not cause symptoms. Very high blood pressure (hypertensive crisis) may cause: Headache. Fast or irregular heartbeats (palpitations). Shortness of breath. Nosebleed. Nausea and vomiting. Vision changes. Severe chest pain, dizziness, and seizures. How is this diagnosed? This condition is diagnosed by measuring your blood pressure while you are seated, with your arm resting on a flat surface, your legs uncrossed, and your feet flat on the floor. The cuff of the blood pressure monitor will be placed directly against the skin of your upper arm at the level of your heart. Blood pressure should be measured at least twice using the same arm. Certain conditions can cause a difference in blood pressure between your right and left arms. If you have a high blood pressure reading during one visit or you have normal blood pressure with other risk factors, you may be asked to: Return on a different day to have your blood pressure checked again. Monitor your blood pressure at home for 1 week or longer. If you are diagnosed with hypertension, you may have other blood or imaging tests to help your health care provider understand your overall risk for other conditions. How is this treated? This condition is treated by making healthy lifestyle changes, such as eating healthy foods, exercising more, and reducing your alcohol intake. You may be referred for counseling on a healthy diet and physical activity. Your health care provider may prescribe medicine if lifestyle changes are not enough to get your blood pressure under control and if: Your systolic blood pressure is above 130. Your  diastolic blood pressure is above 80. Your personal target blood pressure may vary depending on your medical conditions, your age, and other factors. Follow these instructions at home: Eating and drinking  Eat a diet that is high in fiber and potassium, and low in sodium, added sugar, and fat. An example of this eating plan is called the DASH diet. DASH stands for Dietary Approaches to Stop Hypertension. To eat this way: Eat plenty of fresh fruits and vegetables. Try to fill one half of your plate at each meal with fruits and vegetables. Eat whole grains, such as whole-wheat pasta, brown rice, or whole-grain bread. Fill about one fourth of your plate with whole grains. Eat or drink low-fat dairy products, such as skim milk or low-fat yogurt. Avoid fatty cuts of meat, processed or cured meats, and poultry with skin. Fill about one fourth of your plate with lean proteins, such as fish, chicken without skin, beans, eggs, or tofu. Avoid pre-made and processed foods. These tend to be higher in sodium, added sugar, and fat. Reduce your daily sodium intake. Many people with hypertension should eat less than 1,500 mg of sodium a day. Do not drink alcohol if: Your health care provider tells you not to drink. You are pregnant, may be pregnant, or are planning to become pregnant. If you drink alcohol: Limit how much you have to: 0-1 drink a day for women. 0-2 drinks a day for men. Know how much alcohol is in your drink. In the [place] one drink  equals one 12 oz bottle of beer (355 mL), one 5 oz glass of wine (148 mL), or one 1 oz glass of hard liquor (44 mL). Lifestyle  Work with your health care provider to maintain a healthy body weight or to lose weight. Ask what an ideal weight is for you. Get at least 30 minutes of exercise that causes your heart to beat faster (aerobic exercise) most days of the week. Activities may include walking, swimming, or biking. Include exercise to strengthen your muscles  (resistance exercise), such as Pilates or lifting weights, as part of your weekly exercise routine. Try to do these types of exercises for 30 minutes at least 3 days a week. Do not use any products that contain nicotine or tobacco. These products include cigarettes, chewing tobacco, and vaping devices, such as e-cigarettes. If you need help quitting, ask your health care provider. Monitor your blood pressure at home as told by your health care provider. Keep all follow-up visits. This is important. Medicines Take over-the-counter and prescription medicines only as told by your health care provider. Follow directions carefully. Blood pressure medicines must be taken as prescribed. Do not skip doses of blood pressure medicine. Doing this puts you at risk for problems and can make the medicine less effective. Ask your health care provider about side effects or reactions to medicines that you should watch for. Contact a health care provider if you: Think you are having a reaction to a medicine you are taking. Have headaches that keep coming back (recurring). Feel dizzy. Have swelling in your ankles. Have trouble with your vision. Get help right away if you: Develop a severe headache or confusion. Have unusual weakness or numbness. Feel faint. Have severe pain in your chest or abdomen. Vomit repeatedly. Have trouble breathing. These symptoms may be an emergency. Get help right away. Call 911. Do not wait to see if the symptoms will go away. Do not drive yourself to the hospital. Summary Hypertension is when the force of blood pumping through your arteries is too strong. If this condition is not controlled, it may put you at risk for serious complications. Your personal target blood pressure may vary depending on your medical conditions, your age, and other factors. For most people, a normal blood pressure is less than 120/80. Hypertension is treated with lifestyle changes, medicines, or a  combination of both. Lifestyle changes include losing weight, eating a healthy, low-sodium diet, exercising more, and limiting alcohol. This information is not intended to replace advice given to you by your health care provider. Make sure you discuss any questions you have with your health care provider. Document Revised: 10/21/2021 Document Reviewed: 10/21/2021 Elsevier Patient Education  [place], MD [facility]

## 2022-09-02 NOTE — Assessment & Plan Note (Signed)
Elevated blood pressure readings in the office. Had not been taking amlodipine. Continue valsartan 80 mg daily and restart amlodipine 10 mg daily. Cardiovascular risks associated with uncontrolled hypertension discussed. Diet and nutrition discussed. Blood work done today. Follow up in 3 months.

## 2022-09-02 NOTE — Patient Instructions (Signed)
Hypertension, Adult High blood pressure (hypertension) is when the force of blood pumping through the arteries is too strong. The arteries are the blood vessels that carry blood from the heart throughout the body. Hypertension forces the heart to work harder to pump blood and may cause arteries to become narrow or stiff. Untreated or uncontrolled hypertension can lead to a heart attack, heart failure, a stroke, kidney disease, and other problems. A blood pressure reading consists of a higher number over a lower number. Ideally, your blood pressure should be below 120/80. The first ("top") number is called the systolic pressure. It is a measure of the pressure in your arteries as your heart beats. The second ("bottom") number is called the diastolic pressure. It is a measure of the pressure in your arteries as the heart relaxes. What are the causes? The exact cause of this condition is not known. There are some conditions that result in high blood pressure. What increases the risk? Certain factors may make you more likely to develop high blood pressure. Some of these risk factors are under your control, including: Smoking. Not getting enough exercise or physical activity. Being overweight. Having too much fat, sugar, calories, or salt (sodium) in your diet. Drinking too much alcohol. Other risk factors include: Having a personal history of heart disease, diabetes, high cholesterol, or kidney disease. Stress. Having a family history of high blood pressure and high cholesterol. Having obstructive sleep apnea. Age. The risk increases with age. What are the signs or symptoms? High blood pressure may not cause symptoms. Very high blood pressure (hypertensive crisis) may cause: Headache. Fast or irregular heartbeats (palpitations). Shortness of breath. Nosebleed. Nausea and vomiting. Vision changes. Severe chest pain, dizziness, and seizures. How is this diagnosed? This condition is diagnosed by  measuring your blood pressure while you are seated, with your arm resting on a flat surface, your legs uncrossed, and your feet flat on the floor. The cuff of the blood pressure monitor will be placed directly against the skin of your upper arm at the level of your heart. Blood pressure should be measured at least twice using the same arm. Certain conditions can cause a difference in blood pressure between your right and left arms. If you have a high blood pressure reading during one visit or you have normal blood pressure with other risk factors, you may be asked to: Return on a different day to have your blood pressure checked again. Monitor your blood pressure at home for 1 week or longer. If you are diagnosed with hypertension, you may have other blood or imaging tests to help your health care provider understand your overall risk for other conditions. How is this treated? This condition is treated by making healthy lifestyle changes, such as eating healthy foods, exercising more, and reducing your alcohol intake. You may be referred for counseling on a healthy diet and physical activity. Your health care provider may prescribe medicine if lifestyle changes are not enough to get your blood pressure under control and if: Your systolic blood pressure is above 130. Your diastolic blood pressure is above 80. Your personal target blood pressure may vary depending on your medical conditions, your age, and other factors. Follow these instructions at home: Eating and drinking  Eat a diet that is high in fiber and potassium, and low in sodium, added sugar, and fat. An example of this eating plan is called the DASH diet. DASH stands for Dietary Approaches to Stop Hypertension. To eat this way: Eat   plenty of fresh fruits and vegetables. Try to fill one half of your plate at each meal with fruits and vegetables. Eat whole grains, such as whole-wheat pasta, brown rice, or whole-grain bread. Fill about one  fourth of your plate with whole grains. Eat or drink low-fat dairy products, such as skim milk or low-fat yogurt. Avoid fatty cuts of meat, processed or cured meats, and poultry with skin. Fill about one fourth of your plate with lean proteins, such as fish, chicken without skin, beans, eggs, or tofu. Avoid pre-made and processed foods. These tend to be higher in sodium, added sugar, and fat. Reduce your daily sodium intake. Many people with hypertension should eat less than 1,500 mg of sodium a day. Do not drink alcohol if: Your health care provider tells you not to drink. You are pregnant, may be pregnant, or are planning to become pregnant. If you drink alcohol: Limit how much you have to: 0-1 drink a day for women. 0-2 drinks a day for men. Know how much alcohol is in your drink. In the [place] one drink equals one 12 oz bottle of beer (355 mL), one 5 oz glass of wine (148 mL), or one 1 oz glass of hard liquor (44 mL). Lifestyle  Work with your health care provider to maintain a healthy body weight or to lose weight. Ask what an ideal weight is for you. Get at least 30 minutes of exercise that causes your heart to beat faster (aerobic exercise) most days of the week. Activities may include walking, swimming, or biking. Include exercise to strengthen your muscles (resistance exercise), such as Pilates or lifting weights, as part of your weekly exercise routine. Try to do these types of exercises for 30 minutes at least 3 days a week. Do not use any products that contain nicotine or tobacco. These products include cigarettes, chewing tobacco, and vaping devices, such as e-cigarettes. If you need help quitting, ask your health care provider. Monitor your blood pressure at home as told by your health care provider. Keep all follow-up visits. This is important. Medicines Take over-the-counter and prescription medicines only as told by your health care provider. Follow directions carefully. Blood  pressure medicines must be taken as prescribed. Do not skip doses of blood pressure medicine. Doing this puts you at risk for problems and can make the medicine less effective. Ask your health care provider about side effects or reactions to medicines that you should watch for. Contact a health care provider if you: Think you are having a reaction to a medicine you are taking. Have headaches that keep coming back (recurring). Feel dizzy. Have swelling in your ankles. Have trouble with your vision. Get help right away if you: Develop a severe headache or confusion. Have unusual weakness or numbness. Feel faint. Have severe pain in your chest or abdomen. Vomit repeatedly. Have trouble breathing. These symptoms may be an emergency. Get help right away. Call 911. Do not wait to see if the symptoms will go away. Do not drive yourself to the hospital. Summary Hypertension is when the force of blood pumping through your arteries is too strong. If this condition is not controlled, it may put you at risk for serious complications. Your personal target blood pressure may vary depending on your medical conditions, your age, and other factors. For most people, a normal blood pressure is less than 120/80. Hypertension is treated with lifestyle changes, medicines, or a combination of both. Lifestyle changes include losing weight, eating a healthy,   low-sodium diet, exercising more, and limiting alcohol. This information is not intended to replace advice given to you by your health care provider. Make sure you discuss any questions you have with your health care provider. Document Revised: 10/21/2021 Document Reviewed: 10/21/2021 Elsevier Patient Education  2023 [employer]  

## 2022-09-02 NOTE — Assessment & Plan Note (Signed)
Diet and nutrition discussed. Restart atorvastatin 40 mg daily. The 10-year ASCVD risk score (Arnett DK, et al., 2019) is: 24.2%   Values used to calculate the score:     Age: 66 years     Sex: Male     Is Non-Hispanic African American: Yes     Diabetic: No     Tobacco smoker: No     Systolic Blood Pressure: 307 mmHg     Is BP treated: Yes     HDL Cholesterol: 55 mg/dL     Total Cholesterol: 222 mg/dL

## 2022-09-21 ENCOUNTER — Encounter: Payer: Self-pay | Admitting: Gastroenterology

## 2022-10-27 ENCOUNTER — Other Ambulatory Visit: Payer: Self-pay

## 2022-10-27 ENCOUNTER — Ambulatory Visit (AMBULATORY_SURGERY_CENTER): Payer: Self-pay | Admitting: *Deleted

## 2022-10-27 VITALS — Ht 70.0 in | Wt 215.0 lb

## 2022-10-27 DIAGNOSIS — Z86010 Personal history of colonic polyps: Secondary | ICD-10-CM

## 2022-10-27 MED ORDER — NA SULFATE-K SULFATE-MG SULF 17.5-3.13-1.6 GM/177ML PO SOLN: 1.0000 | Freq: Once | ORAL | 0 refills | Status: AC

## 2022-10-27 NOTE — Progress Notes (Signed)
PRE VISIT COMPLETED IN PERSON.   No egg or soy allergy known to patient  No issues known to pt with past sedation with any surgeries or procedures Patient denies ever being told they had issues or difficulty with intubation  No FH of Malignant Hyperthermia Pt is not on diet pills Pt is not on  home 02  Pt is not on blood thinners  Pt denies issues with constipation  No A fib or A flutter Pt instructed to use Singlecare.com or GoodRx for a price reduction on prep

## 2022-11-10 ENCOUNTER — Encounter: Payer: Self-pay | Admitting: Gastroenterology

## 2022-11-17 ENCOUNTER — Ambulatory Visit (AMBULATORY_SURGERY_CENTER): Payer: Medicare Other | Admitting: Gastroenterology

## 2022-11-17 ENCOUNTER — Encounter: Payer: Self-pay | Admitting: Gastroenterology

## 2022-11-17 VITALS — BP 137/76 | HR 61 | Temp 98.4°F | Resp 12 | Ht 70.0 in | Wt 215.0 lb

## 2022-11-17 DIAGNOSIS — D123 Benign neoplasm of transverse colon: Secondary | ICD-10-CM

## 2022-11-17 DIAGNOSIS — D122 Benign neoplasm of ascending colon: Secondary | ICD-10-CM

## 2022-11-17 DIAGNOSIS — Z09 Encounter for follow-up examination after completed treatment for conditions other than malignant neoplasm: Secondary | ICD-10-CM | POA: Diagnosis not present

## 2022-11-17 DIAGNOSIS — Z86010 Personal history of colonic polyps: Secondary | ICD-10-CM

## 2022-11-17 DIAGNOSIS — I10 Essential (primary) hypertension: Secondary | ICD-10-CM | POA: Diagnosis not present

## 2022-11-17 MED ORDER — SODIUM CHLORIDE 0.9 % IV SOLN: 500.0000 mL | Freq: Once | INTRAVENOUS | Status: AC

## 2022-11-17 NOTE — Patient Instructions (Signed)
YOU HAD AN ENDOSCOPIC PROCEDURE TODAY AT [facility]:   Refer to the procedure report that was given to you for any specific questions about what was found during the examination.  If the procedure report does not answer your questions, please call your gastroenterologist to clarify.  If you requested that your care partner not be given the details of your procedure findings, then the procedure report has been included in a sealed envelope for you to review at your convenience later.  YOU SHOULD EXPECT: Some feelings of bloating in the abdomen. Passage of more gas than usual.  Walking can help get rid of the air that was put into your GI tract during the procedure and reduce the bloating. If you had a lower endoscopy (such as a colonoscopy or flexible sigmoidoscopy) you may notice spotting of blood in your stool or on the toilet paper. If you underwent a bowel prep for your procedure, you may not have a normal bowel movement for a few days.  Please Note:  You might notice some irritation and congestion in your nose or some drainage.  This is from the oxygen used during your procedure.  There is no need for concern and it should clear up in a day or so.  SYMPTOMS TO REPORT IMMEDIATELY:  Following lower endoscopy (colonoscopy or flexible sigmoidoscopy):  Excessive amounts of blood in the stool  Significant tenderness or worsening of abdominal pains  Swelling of the abdomen that is new, acute  Fever of 100F or higher   For urgent or emergent issues, a gastroenterologist can be reached at any hour by calling [phone]. Do not use MyChart messaging for urgent concerns.    DIET:  We do recommend a small meal at first, but then you may proceed to your regular diet.  Drink plenty of fluids but you should avoid alcoholic beverages for 24 hours.  MEDICATIONS: Continue present medications.  Please see handouts given to you by your recovery nurse: Polyps and hemorroids.  FOLLOW  UP: Await pathology results.  Thank you for allowing [place] to provide for your healthcare needs today.  ACTIVITY:  You should plan to take it easy for the rest of today and you should NOT DRIVE or use heavy machinery until tomorrow (because of the sedation medicines used during the test).    FOLLOW UP: Our staff will call the number listed on your records the next business day following your procedure.  We will call around 7:15- 8:00 am to check on you and address any questions or concerns that you may have regarding the information given to you following your procedure. If we do not reach you, we will leave a message.     If any biopsies were taken you will be contacted by phone or by letter within the next 1-3 weeks.  Please call us at [phone] if you have not heard about the biopsies in 3 weeks.    SIGNATURES/CONFIDENTIALITY: You and/or your care partner have signed paperwork which will be entered into your electronic medical record.  These signatures attest to the fact that that the information above on your After Visit Summary has been reviewed and is understood.  Full responsibility of the confidentiality of this discharge information lies with you and/or your care-partner.

## 2022-11-17 NOTE — Progress Notes (Signed)
Pt's states no medical or surgical changes since previsit or office visit. 

## 2022-11-17 NOTE — Progress Notes (Signed)
Called to room to assist during endoscopic procedure.  Patient ID and intended procedure confirmed with present staff. Received instructions for my participation in the procedure from the performing physician.  

## 2022-11-17 NOTE — Progress Notes (Signed)
History & Physical  Primary Care Physician:  Horald Pollen, MD Primary Gastroenterologist: Lucio Edward, MD  CHIEF COMPLAINT:  Personal history of colon polyps   HPI: Justin Santos is a 66 y.o. male with a personal history of adenomatous colon polyps for colonoscopy.   Past Medical History:  Diagnosis Date   Allergy    Arthritis    Environmental allergies    GERD (gastroesophageal reflux disease)    Hyperlipidemia    Hypertension    IBS (irritable bowel syndrome)     Past Surgical History:  Procedure Laterality Date   COLECTOMY  12/29/1999   COLONOSCOPY     UPPER GASTROINTESTINAL ENDOSCOPY      Prior to Admission medications   Medication Sig Start Date End Date Taking? Authorizing Provider  acetaminophen (TYLENOL) 500 MG tablet Take 500-1,000 mg by mouth every 6 (six) hours as needed for mild pain or headache.   Yes [provider]  amLODipine (NORVASC) 10 MG tablet Take 1 tablet (10 mg total) by mouth daily. 09/02/22 08/28/23 Yes SagardiaInes Bloomer, MD  aspirin EC 81 MG tablet Take 81 mg by mouth daily.   Yes [provider]  atorvastatin (LIPITOR) 40 MG tablet Take 1 tablet (40 mg total) by mouth daily. 09/02/22  Yes Sagardia, [name], MD  baclofen (LIORESAL) 10 MG tablet Take 0.5-1 tablets (5-10 mg total) by mouth at bedtime as needed for muscle spasms. 05/30/21  Yes Hilts, Legrand Como, MD  valsartan (DIOVAN) 80 MG tablet Take 1 tablet (80 mg total) by mouth daily. 11/24/21  Yes Sagardia, [name], MD  celecoxib (CELEBREX) 100 MG capsule Take 1 capsule (100 mg total) by mouth 2 (two) times daily as needed. 05/30/21   Hilts, Legrand Como, MD  cetirizine (ZYRTEC) 10 MG tablet Take 10 mg by mouth daily as needed for allergies or rhinitis.    [provider]    Current Outpatient Medications  Medication Sig Dispense Refill   acetaminophen (TYLENOL) 500 MG tablet Take 500-1,000 mg by mouth every 6 (six) hours as needed for mild pain or headache.      amLODipine (NORVASC) 10 MG tablet Take 1 tablet (10 mg total) by mouth daily. 90 tablet 3   aspirin EC 81 MG tablet Take 81 mg by mouth daily.     atorvastatin (LIPITOR) 40 MG tablet Take 1 tablet (40 mg total) by mouth daily. 90 tablet 3   baclofen (LIORESAL) 10 MG tablet Take 0.5-1 tablets (5-10 mg total) by mouth at bedtime as needed for muscle spasms. 30 each 3   valsartan (DIOVAN) 80 MG tablet Take 1 tablet (80 mg total) by mouth daily. 90 tablet 3   celecoxib (CELEBREX) 100 MG capsule Take 1 capsule (100 mg total) by mouth 2 (two) times daily as needed. 60 capsule 3   cetirizine (ZYRTEC) 10 MG tablet Take 10 mg by mouth daily as needed for allergies or rhinitis.     Current Facility-Administered Medications  Medication Dose Route Frequency Provider Last Rate Last Admin   0.9 %  sodium chloride infusion  500 mL Intravenous Once Ladene Artist, MD        Allergies as of 11/17/2022 - Review Complete 11/17/2022  Allergen Reaction Noted   Peanuts [peanut oil] Shortness Of Breath, Itching, and Rash 04/28/2012   Azithromycin Hives 03/06/2019    Family History  Problem Relation Age of Onset   Heart disease Father    Cancer - Colon Neg Hx    Stomach  cancer Neg Hx    Esophageal cancer Neg Hx    Rectal cancer Neg Hx     Social History   Socioeconomic History   Marital status: Married    Spouse name: Not on file   Number of children: 2   Years of education: Not on file   Highest education level: Not on file  Occupational History   Not on file  Tobacco Use   Smoking status: Former    Types: Cigarettes    Quit date: 04/26/1994    Years since quitting: 28.5   Smokeless tobacco: Never  Vaping Use   Vaping Use: Never used  Substance and Sexual Activity   Alcohol use: No   Drug use: No   Sexual activity: Yes    Partners: Female    Comment: with monogamous partner  Other Topics Concern   Not on file  Social History Narrative   Pt is from [place], which is where he  currently lives with his wife. 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    Social Determinants of Health   Financial Resource Strain: Not on file  Food Insecurity: No Food Insecurity (05/31/2018)   Hunger Vital Sign    Worried About Running Out of Food in the Last Year: Never true    Ran Out of Food in the Last Year: Never true  Transportation Needs: No Transportation Needs (05/31/2018)   PRAPARE - Hydrologist (Medical): No    Lack of Transportation (Non-Medical): No  Physical Activity: Inactive (05/31/2018)   Exercise Vital Sign    Days of Exercise per Week: 0 days    Minutes of Exercise per Session: 0 min  Stress: Not on file  Social Connections: Socially Integrated (05/31/2018)   Social Connection and Isolation Panel [NHANES]    Frequency of Communication with Friends and Family: More than three times a week    Frequency of Social Gatherings with Friends and Family: Once a week    Attends Religious Services: More than 4 times per year    Active Member of [employer] or Organizations: Yes    Attends Music therapist: More than 4 times per year    Marital Status: Married  Human resources officer Violence: Not At Risk (05/31/2018)   Humiliation, Afraid, Rape, and Kick questionnaire    Fear of Current or Ex-Partner: No    Emotionally Abused: No    Physically Abused: No    Sexually Abused: No    Review of Systems:  All systems reviewed were negative except where noted in HPI.   Physical Exam:   General:  Alert, well-developed, in NAD Head:  Normocephalic and atraumatic. Eyes:  Sclera clear, no icterus.   Conjunctiva pink. Ears:  Normal auditory acuity. Mouth:  No deformity or lesions.  Neck:  Supple; no masses . Lungs:  Clear throughout to auscultation.   No wheezes, crackles, or rhonchi. No acute distress. Heart:  Regular rate and rhythm; no murmurs. Abdomen:  Soft, nondistended, nontender. No masses, hepatomegaly. No obvious masses.  Normal bowel .    Rectal:   Deferred   Msk:  Symmetrical without gross deformities.. Pulses:  Normal pulses noted. Extremities:  Without edema. Neurologic:  Alert and  oriented x4;  grossly normal neurologically. Skin:  Intact without significant lesions or rashes. Cervical Nodes:  No significant cervical adenopathy. Psych:  Alert and cooperative. Normal mood and affect.  Impression / Plan:   Personal history of adenomatous colon polyps for colonoscopy.  Norberto Sorenson  Sindy Guadeloupe  11/17/2022, 11:19 AM See Shea Evans, [facility], to contact our on call provider

## 2022-11-17 NOTE — Op Note (Signed)
[facility] Patient Name: Justin Santos Procedure Date: 11/17/2022 11:12 AM MRN: [id number] Endoscopist: Ladene Artist , MD, [id number] Age: 66 Referring MD:  Date of Birth: 1956/11/26 Gender: Male Account #: [id number] Procedure:                Colonoscopy Indications:              Surveillance: Personal history of adenomatous                            polyps on last colonoscopy > 5 years ago Medicines:                Monitored Anesthesia Care Procedure:                Pre-Anesthesia Assessment:                           - Prior to the procedure, a History and Physical                            was performed, and patient medications and                            allergies were reviewed. The patient's tolerance of                            previous anesthesia was also reviewed. The risks                            and benefits of the procedure and the sedation                            options and risks were discussed with the patient.                            All questions were answered, and informed consent                            was obtained. Prior Anticoagulants: The patient has                            taken no anticoagulant or antiplatelet agents. ASA                            Grade Assessment: II - A patient with mild systemic                            disease. After reviewing the risks and benefits,                            the patient was deemed in satisfactory condition to                            undergo the procedure.  After obtaining informed consent, the colonoscope                            was passed under direct vision. Throughout the                            procedure, the patient's blood pressure, pulse, and                            oxygen saturations were monitored continuously. The                            CF [id number] #[id number] was introduced through the anus                            and advanced to the  the cecum, identified by                            appendiceal orifice and ileocecal valve. The                            ileocecal valve, appendiceal orifice, and rectum                            were photographed. The quality of the bowel                            preparation was good. The colonoscopy was performed                            without difficulty. The patient tolerated the                            procedure well. Scope In: 11:25:55 AM Scope Out: 11:41:55 AM Scope Withdrawal Time: 0 hours 13 minutes 24 seconds  Total Procedure Duration: 0 hours 16 minutes 0 seconds  Findings:                 The perianal and digital rectal examinations were                            normal.                           Five sessile polyps were found in the transverse                            colon (4) and ascending colon (1). The polyps were                            6 to 8 mm in size. These polyps were removed with a                            cold snare. Resection and retrieval were complete.  There was evidence of a prior end-to-side                            colo-colonic anastomosis in the sigmoid colon. This                            was patent and was characterized by healthy                            appearing mucosa. The anastomosis was traversed.                           Internal hemorrhoids were found during                            retroflexion. The hemorrhoids were small and Grade                            I (internal hemorrhoids that do not prolapse).                           The exam was otherwise without abnormality on                            direct and retroflexion views. Complications:            No immediate complications. Estimated blood loss:                            None. Estimated Blood Loss:     Estimated blood loss: none. Impression:               - Five 6 to 8 mm polyps in the transverse colon and                             in the ascending colon, removed with a cold snare.                            Resected and retrieved.                           - Patent end-to-side colo-colonic anastomosis,                            characterized by healthy appearing mucosa.                           - Internal hemorrhoids.                           - The examination was otherwise normal on direct                            and retroflexion views. Recommendation:           - Repeat colonoscopy after studies are complete for  surveillance based on pathology results.                           - Patient has a contact number available for                            emergencies. The signs and symptoms of potential                            delayed complications were discussed with the                            patient. Return to normal activities tomorrow.                            Written discharge instructions were provided to the                            patient.                           - Resume previous diet.                           - Continue present medications.                           - Await pathology results. Ladene Artist, MD 11/17/2022 11:46:25 AM This report has been signed electronically.

## 2022-11-17 NOTE — Progress Notes (Signed)
Report to PACU, RN, vss, BBS= Clear.  

## 2022-11-18 ENCOUNTER — Telehealth: Payer: Self-pay

## 2022-11-18 NOTE — Telephone Encounter (Signed)
  Follow up Call-     11/17/2022   10:41 AM  Call back number  Post procedure Call Back phone  # [phone]  Permission to leave phone message Yes     Patient questions:  Do you have a fever, pain , or abdominal swelling? No. Pain Score  0 *  Have you tolerated food without any problems? Yes.    Have you been able to return to your normal activities? Yes.    Do you have any questions about your discharge instructions: Diet   No. Medications  No. Follow up visit  No.  Do you have questions or concerns about your Care? No.  Actions: * If pain score is 4 or above: No action needed, pain <4.

## 2022-12-01 ENCOUNTER — Encounter: Payer: Self-pay | Admitting: Gastroenterology

## 2023-03-23 DIAGNOSIS — J0100 Acute maxillary sinusitis, unspecified: Secondary | ICD-10-CM | POA: Diagnosis not present

## 2023-07-22 DIAGNOSIS — R600 Localized edema: Secondary | ICD-10-CM | POA: Diagnosis not present

## 2023-07-27 ENCOUNTER — Encounter: Payer: Self-pay | Admitting: Emergency Medicine

## 2023-07-27 ENCOUNTER — Ambulatory Visit (INDEPENDENT_AMBULATORY_CARE_PROVIDER_SITE_OTHER): Payer: Medicare Other | Admitting: Emergency Medicine

## 2023-07-27 ENCOUNTER — Ambulatory Visit (INDEPENDENT_AMBULATORY_CARE_PROVIDER_SITE_OTHER): Payer: Medicare Other

## 2023-07-27 VITALS — BP 138/84 | HR 71 | Temp 98.4°F | Ht 70.0 in | Wt 221.2 lb

## 2023-07-27 DIAGNOSIS — I517 Cardiomegaly: Secondary | ICD-10-CM | POA: Diagnosis not present

## 2023-07-27 DIAGNOSIS — I10 Essential (primary) hypertension: Secondary | ICD-10-CM

## 2023-07-27 DIAGNOSIS — R600 Localized edema: Secondary | ICD-10-CM | POA: Diagnosis not present

## 2023-07-27 DIAGNOSIS — E785 Hyperlipidemia, unspecified: Secondary | ICD-10-CM

## 2023-07-27 MED ORDER — FUROSEMIDE 20 MG PO TABS
20.0000 mg | ORAL_TABLET | Freq: Every day | ORAL | 1 refills | Status: DC
Start: 2023-07-27 — End: 2024-09-05

## 2023-07-27 NOTE — Patient Instructions (Signed)
Hypertension, Adult High blood pressure (hypertension) is when the force of blood pumping through the arteries is too strong. The arteries are the blood vessels that carry blood from the heart throughout the body. Hypertension forces the heart to work harder to pump blood and may cause arteries to become narrow or stiff. Untreated or uncontrolled hypertension can lead to a heart attack, heart failure, a stroke, kidney disease, and other problems. A blood pressure reading consists of a higher number over a lower number. Ideally, your blood pressure should be below 120/80. The first ("top") number is called the systolic pressure. It is a measure of the pressure in your arteries as your heart beats. The second ("bottom") number is called the diastolic pressure. It is a measure of the pressure in your arteries as the heart relaxes. What are the causes? The exact cause of this condition is not known. There are some conditions that result in high blood pressure. What increases the risk? Certain factors may make you more likely to develop high blood pressure. Some of these risk factors are under your control, including: Smoking. Not getting enough exercise or physical activity. Being overweight. Having too much fat, sugar, calories, or salt (sodium) in your diet. Drinking too much alcohol. Other risk factors include: Having a personal history of heart disease, diabetes, high cholesterol, or kidney disease. Stress. Having a family history of high blood pressure and high cholesterol. Having obstructive sleep apnea. Age. The risk increases with age. What are the signs or symptoms? High blood pressure may not cause symptoms. Very high blood pressure (hypertensive crisis) may cause: Headache. Fast or irregular heartbeats (palpitations). Shortness of breath. Nosebleed. Nausea and vomiting. Vision changes. Severe chest pain, dizziness, and seizures. How is this diagnosed? This condition is diagnosed by  measuring your blood pressure while you are seated, with your arm resting on a flat surface, your legs uncrossed, and your feet flat on the floor. The cuff of the blood pressure monitor will be placed directly against the skin of your upper arm at the level of your heart. Blood pressure should be measured at least twice using the same arm. Certain conditions can cause a difference in blood pressure between your right and left arms. If you have a high blood pressure reading during one visit or you have normal blood pressure with other risk factors, you may be asked to: Return on a different day to have your blood pressure checked again. Monitor your blood pressure at home for 1 week or longer. If you are diagnosed with hypertension, you may have other blood or imaging tests to help your health care provider understand your overall risk for other conditions. How is this treated? This condition is treated by making healthy lifestyle changes, such as eating healthy foods, exercising more, and reducing your alcohol intake. You may be referred for counseling on a healthy diet and physical activity. Your health care provider may prescribe medicine if lifestyle changes are not enough to get your blood pressure under control and if: Your systolic blood pressure is above 130. Your diastolic blood pressure is above 80. Your personal target blood pressure may vary depending on your medical conditions, your age, and other factors. Follow these instructions at home: Eating and drinking  Eat a diet that is high in fiber and potassium, and low in sodium, added sugar, and fat. An example of this eating plan is called the DASH diet. DASH stands for Dietary Approaches to Stop Hypertension. To eat this way: Eat   plenty of fresh fruits and vegetables. Try to fill one half of your plate at each meal with fruits and vegetables. Eat whole grains, such as whole-wheat pasta, brown rice, or whole-grain bread. Fill about one  fourth of your plate with whole grains. Eat or drink low-fat dairy products, such as skim milk or low-fat yogurt. Avoid fatty cuts of meat, processed or cured meats, and poultry with skin. Fill about one fourth of your plate with lean proteins, such as fish, chicken without skin, beans, eggs, or tofu. Avoid pre-made and processed foods. These tend to be higher in sodium, added sugar, and fat. Reduce your daily sodium intake. Many people with hypertension should eat less than 1,500 mg of sodium a day. Do not drink alcohol if: Your health care provider tells you not to drink. You are pregnant, may be pregnant, or are planning to become pregnant. If you drink alcohol: Limit how much you have to: 0-1 drink a day for women. 0-2 drinks a day for men. Know how much alcohol is in your drink. In the [place] one drink equals one 12 oz bottle of beer (355 mL), one 5 oz glass of wine (148 mL), or one 1 oz glass of hard liquor (44 mL). Lifestyle  Work with your health care provider to maintain a healthy body weight or to lose weight. Ask what an ideal weight is for you. Get at least 30 minutes of exercise that causes your heart to beat faster (aerobic exercise) most days of the week. Activities may include walking, swimming, or biking. Include exercise to strengthen your muscles (resistance exercise), such as Pilates or lifting weights, as part of your weekly exercise routine. Try to do these types of exercises for 30 minutes at least 3 days a week. Do not use any products that contain nicotine or tobacco. These products include cigarettes, chewing tobacco, and vaping devices, such as e-cigarettes. If you need help quitting, ask your health care provider. Monitor your blood pressure at home as told by your health care provider. Keep all follow-up visits. This is important. Medicines Take over-the-counter and prescription medicines only as told by your health care provider. Follow directions carefully. Blood  pressure medicines must be taken as prescribed. Do not skip doses of blood pressure medicine. Doing this puts you at risk for problems and can make the medicine less effective. Ask your health care provider about side effects or reactions to medicines that you should watch for. Contact a health care provider if you: Think you are having a reaction to a medicine you are taking. Have headaches that keep coming back (recurring). Feel dizzy. Have swelling in your ankles. Have trouble with your vision. Get help right away if you: Develop a severe headache or confusion. Have unusual weakness or numbness. Feel faint. Have severe pain in your chest or abdomen. Vomit repeatedly. Have trouble breathing. These symptoms may be an emergency. Get help right away. Call 911. Do not wait to see if the symptoms will go away. Do not drive yourself to the hospital. Summary Hypertension is when the force of blood pumping through your arteries is too strong. If this condition is not controlled, it may put you at risk for serious complications. Your personal target blood pressure may vary depending on your medical conditions, your age, and other factors. For most people, a normal blood pressure is less than 120/80. Hypertension is treated with lifestyle changes, medicines, or a combination of both. Lifestyle changes include losing weight, eating a healthy,   low-sodium diet, exercising more, and limiting alcohol. This information is not intended to replace advice given to you by your health care provider. Make sure you discuss any questions you have with your health care provider. Document Revised: 10/21/2021 Document Reviewed: 10/21/2021 [employer] Patient Education  2024 [employer]  

## 2023-07-27 NOTE — Assessment & Plan Note (Addendum)
Secondary to longstanding hypertension Possible hypertensive heart disease Needs echocardiogram. Cardiology referral placed today.

## 2023-07-27 NOTE — Assessment & Plan Note (Addendum)
Differential diagnosis discussed Chest x-ray done today.  Report reviewed.  Normal heart size. EKG shows LVH. Continue Lasix 20 mg daily Recommend leg elevation and low-salt diet Needs cardiology evaluation Referral placed today Diet and nutrition discussed

## 2023-07-27 NOTE — Assessment & Plan Note (Signed)
BP Readings from Last 3 Encounters:  07/27/23 138/84  11/17/22 137/76  09/02/22 (!) 156/90  Well-controlled hypertension For now we will continue amlodipine 10 mg and valsartan 80 g daily Also continue daily furosemide 20 mg on daily baby aspirin Cardiovascular risks associated with hypertension discussed Benefits of exercise discussed Dietary approaches to stop hypertension discussed

## 2023-07-27 NOTE — Assessment & Plan Note (Signed)
Chronic stable condition. Lipid profile done today Continue atorvastatin 40 mg daily

## 2023-07-27 NOTE — Progress Notes (Signed)
Justin Santos 67 y.o.   Chief Complaint  Patient presents with   Leg Swelling    Patient states his legs has been swelling, went to the Urgent care in [place] and was giving a furosemide. Patient states the swelling has gotten better     HISTORY OF PRESENT ILLNESS: This is a 67 y.o. male complaining of swelling of both legs for 2 weeks Was seen recently at urgent care center.  Started on Lasix which has helped a lot. Denies chest pain or difficulty breathing. No history of kidney disease Non-smoker No other complaint or medical concerns today.  HPI   Prior to Admission medications   Medication Sig Start Date End Date Taking? Authorizing Provider  acetaminophen (TYLENOL) 500 MG tablet Take 500-1,000 mg by mouth every 6 (six) hours as needed for mild pain or headache.   Yes [provider]  amLODipine (NORVASC) 10 MG tablet Take 1 tablet (10 mg total) by mouth daily. 09/02/22 08/28/23 Yes SagardiaEilleen Kempf, MD  aspirin EC 81 MG tablet Take 81 mg by mouth daily.   Yes [provider]  atorvastatin (LIPITOR) 40 MG tablet Take 1 tablet (40 mg total) by mouth daily. 09/02/22  Yes Bora Broner, Eilleen Kempf, MD  baclofen (LIORESAL) 10 MG tablet Take 0.5-1 tablets (5-10 mg total) by mouth at bedtime as needed for muscle spasms. 05/30/21  Yes Hilts, Casimiro Needle, MD  celecoxib (CELEBREX) 100 MG capsule Take 1 capsule (100 mg total) by mouth 2 (two) times daily as needed. 05/30/21  Yes Hilts, Casimiro Needle, MD  cetirizine (ZYRTEC) 10 MG tablet Take 10 mg by mouth daily as needed for allergies or rhinitis.   Yes [provider]  furosemide (LASIX) 20 MG tablet Take by mouth. 07/22/23 07/29/23 Yes [provider]  valsartan (DIOVAN) 80 MG tablet Take 1 tablet (80 mg total) by mouth daily. 11/24/21  Yes Charlayne Vultaggio, Eilleen Kempf, MD    Allergies  Allergen Reactions   Peanuts [Peanut Oil] Shortness Of Breath, Itching and Rash    Mouth and throat itch and this causes wheezing    Azithromycin Hives    Patient Active Problem List   Diagnosis Date Noted   Neurogenic claudication 05/21/2021   Chronic bilateral low back pain with bilateral sciatica 05/21/2021   Sacroiliitis (HCC) 11/20/2020   Dyslipidemia 08/23/2019   History of CVA (cerebrovascular accident) 08/23/2019   Essential hypertension 03/06/2019   Overweight 08/01/2013    Past Medical History:  Diagnosis Date   Allergy    Arthritis    Environmental allergies    GERD (gastroesophageal reflux disease)    Hyperlipidemia    Hypertension    IBS (irritable bowel syndrome)     Past Surgical History:  Procedure Laterality Date   COLECTOMY  12/29/1999   COLONOSCOPY     UPPER GASTROINTESTINAL ENDOSCOPY      Social History   Socioeconomic History   Marital status: Married    Spouse name: Not on file   Number of children: 2   Years of education: Not on file   Highest education level: Not on file  Occupational History   Not on file  Tobacco Use   Smoking status: Former    Current packs/day: 0.00    Types: Cigarettes    Quit date: 04/26/1994    Years since quitting: 29.2   Smokeless tobacco: Never  Vaping Use   Vaping status: Never Used  Substance and Sexual Activity   Alcohol use: No   Drug use: No  Sexual activity: Yes    Partners: Female    Comment: with monogamous partner  Other Topics Concern   Not on file  Social History Narrative   Pt is from [place], which is where he currently lives with his wife. 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    Social Determinants of Health   Financial Resource Strain: Not on file  Food Insecurity: No Food Insecurity (05/31/2018)   Hunger Vital Sign    Worried About Running Out of Food in the Last Year: Never true    Ran Out of Food in the Last Year: Never true  Transportation Needs: No Transportation Needs (05/31/2018)   PRAPARE - Administrator, Civil Service (Medical): No    Lack of Transportation (Non-Medical): No  Physical Activity:  Inactive (05/31/2018)   Exercise Vital Sign    Days of Exercise per Week: 0 days    Minutes of Exercise per Session: 0 min  Stress: Not on file  Social Connections: Socially Integrated (05/31/2018)   Social Connection and Isolation Panel [NHANES]    Frequency of Communication with Friends and Family: More than three times a week    Frequency of Social Gatherings with Friends and Family: Once a week    Attends Religious Services: More than 4 times per year    Active Member of [employer] or Organizations: Yes    Attends Engineer, structural: More than 4 times per year    Marital Status: Married  Catering manager Violence: Not At Risk (05/31/2018)   Humiliation, Afraid, Rape, and Kick questionnaire    Fear of Current or Ex-Partner: No    Emotionally Abused: No    Physically Abused: No    Sexually Abused: No    Family History  Problem Relation Age of Onset   Heart disease Father    Cancer - Colon Neg Hx    Stomach cancer Neg Hx    Esophageal cancer Neg Hx    Rectal cancer Neg Hx      Review of Systems  Constitutional: Negative.  Negative for chills and fever.  HENT: Negative.  Negative for congestion and sore throat.   Respiratory: Negative.  Negative for cough and shortness of breath.   Cardiovascular:  Positive for leg swelling. Negative for chest pain and palpitations.  Gastrointestinal:  Negative for abdominal pain, diarrhea, nausea and vomiting.  Genitourinary: Negative.  Negative for dysuria and hematuria.  Skin: Negative.  Negative for rash.  Neurological: Negative.  Negative for dizziness and headaches.  All other systems reviewed and are negative.   Vitals:   07/27/23 1515  BP: 138/84  Pulse: 71  Temp: 98.4 F (36.9 C)  SpO2: 95%    Physical Exam Vitals reviewed.  Constitutional:      Appearance: Normal appearance.  HENT:     Head: Normocephalic.  Eyes:     Extraocular Movements: Extraocular movements intact.     Pupils: Pupils are equal, round, and  reactive to light.  Cardiovascular:     Rate and Rhythm: Normal rate and regular rhythm.     Pulses: Normal pulses.     Heart sounds: Normal heart sounds.  Pulmonary:     Effort: Pulmonary effort is normal.     Breath sounds: Normal breath sounds.  Abdominal:     Palpations: Abdomen is soft.     Tenderness: There is no abdominal tenderness.  Musculoskeletal:     Cervical back: No tenderness.     Right lower leg: Edema present.  Left lower leg: Edema present.  Lymphadenopathy:     Cervical: No cervical adenopathy.  Skin:    Capillary Refill: Capillary refill takes less than 2 seconds.  Neurological:     General: No focal deficit present.     Mental Status: He is alert and oriented to person, place, and time.  Psychiatric:        Mood and Affect: Mood normal.        Behavior: Behavior normal.    EKG: Normal sinus rhythm with ventricular rate of 64/min.  LVH by voltage criteria.  No acute ischemic changes.  DG Chest 2 View  Result Date: 07/27/2023 CLINICAL DATA:  Hypertension, edema in the lower extremities EXAM: CHEST - 2 VIEW COMPARISON:  10/23/2016 FINDINGS: Cardiac size is within normal limits. There are no signs of pulmonary edema or focal pulmonary consolidation. There is no pleural effusion or pneumothorax. IMPRESSION: No active cardiopulmonary disease. Electronically Signed   By: Ernie Avena M.D.   On: 07/27/2023 16:36     ASSESSMENT & PLAN: A total of 48 minutes was spent with the patient and counseling/coordination of care regarding preparing for this visit, review of most recent office visit notes, review of multiple chronic medical conditions under management, cardiovascular risks associated with hypertension, review of all medications and changes made, management of peripheral edema, review of chest x-ray report done today, education on nutrition, need for cardiology evaluation, prognosis, documentation and need for follow-up.  Problem List Items Addressed  This Visit       Cardiovascular and Mediastinum   Essential hypertension - Primary    BP Readings from Last 3 Encounters:  07/27/23 138/84  11/17/22 137/76  09/02/22 (!) 156/90  Well-controlled hypertension For now we will continue amlodipine 10 mg and valsartan 80 g daily Also continue daily furosemide 20 mg on daily baby aspirin Cardiovascular risks associated with hypertension discussed Benefits of exercise discussed Dietary approaches to stop hypertension discussed       Relevant Medications   furosemide (LASIX) 20 MG tablet   Other Relevant Orders   EKG 12-Lead   CBC with Differential/Platelet   Comprehensive metabolic panel   Hemoglobin A1c   Lipid panel   Urinalysis   DG Chest 2 View (Completed)   Ambulatory referral to Cardiology   LVH (left ventricular hypertrophy)    Secondary to longstanding hypertension Possible hypertensive heart disease Needs echocardiogram. Cardiology referral placed today.      Relevant Medications   furosemide (LASIX) 20 MG tablet   Other Relevant Orders   Ambulatory referral to Cardiology     Other   Dyslipidemia    Chronic stable condition. Lipid profile done today Continue atorvastatin 40 mg daily       Relevant Orders   CBC with Differential/Platelet   Comprehensive metabolic panel   Hemoglobin A1c   Lipid panel   Urinalysis   Ambulatory referral to Cardiology   Peripheral edema    Differential diagnosis discussed Chest x-ray done today.  Report reviewed.  Normal heart size. EKG shows LVH. Continue Lasix 20 mg daily Recommend leg elevation and low-salt diet Needs cardiology evaluation Referral placed today Diet and nutrition discussed      Relevant Medications   furosemide (LASIX) 20 MG tablet   Other Relevant Orders   CBC with Differential/Platelet   Comprehensive metabolic panel   Hemoglobin A1c   Lipid panel   Urinalysis   DG Chest 2 View (Completed)   Ambulatory referral to Cardiology     Patient  Instructions  Hypertension, Adult High blood pressure (hypertension) is when the force of blood pumping through the arteries is too strong. The arteries are the blood vessels that carry blood from the heart throughout the body. Hypertension forces the heart to work harder to pump blood and may cause arteries to become narrow or stiff. Untreated or uncontrolled hypertension can lead to a heart attack, heart failure, a stroke, kidney disease, and other problems. A blood pressure reading consists of a higher number over a lower number. Ideally, your blood pressure should be below 120/80. The first ("top") number is called the systolic pressure. It is a measure of the pressure in your arteries as your heart beats. The second ("bottom") number is called the diastolic pressure. It is a measure of the pressure in your arteries as the heart relaxes. What are the causes? The exact cause of this condition is not known. There are some conditions that result in high blood pressure. What increases the risk? Certain factors may make you more likely to develop high blood pressure. Some of these risk factors are under your control, including: Smoking. Not getting enough exercise or physical activity. Being overweight. Having too much fat, sugar, calories, or salt (sodium) in your diet. Drinking too much alcohol. Other risk factors include: Having a personal history of heart disease, diabetes, high cholesterol, or kidney disease. Stress. Having a family history of high blood pressure and high cholesterol. Having obstructive sleep apnea. Age. The risk increases with age. What are the signs or symptoms? High blood pressure may not cause symptoms. Very high blood pressure (hypertensive crisis) may cause: Headache. Fast or irregular heartbeats (palpitations). Shortness of breath. Nosebleed. Nausea and vomiting. Vision changes. Severe chest pain, dizziness, and seizures. How is this diagnosed? This condition  is diagnosed by measuring your blood pressure while you are seated, with your arm resting on a flat surface, your legs uncrossed, and your feet flat on the floor. The cuff of the blood pressure monitor will be placed directly against the skin of your upper arm at the level of your heart. Blood pressure should be measured at least twice using the same arm. Certain conditions can cause a difference in blood pressure between your right and left arms. If you have a high blood pressure reading during one visit or you have normal blood pressure with other risk factors, you may be asked to: Return on a different day to have your blood pressure checked again. Monitor your blood pressure at home for 1 week or longer. If you are diagnosed with hypertension, you may have other blood or imaging tests to help your health care provider understand your overall risk for other conditions. How is this treated? This condition is treated by making healthy lifestyle changes, such as eating healthy foods, exercising more, and reducing your alcohol intake. You may be referred for counseling on a healthy diet and physical activity. Your health care provider may prescribe medicine if lifestyle changes are not enough to get your blood pressure under control and if: Your systolic blood pressure is above 130. Your diastolic blood pressure is above 80. Your personal target blood pressure may vary depending on your medical conditions, your age, and other factors. Follow these instructions at home: Eating and drinking  Eat a diet that is high in fiber and potassium, and low in sodium, added sugar, and fat. An example of this eating plan is called the DASH diet. DASH stands for Dietary Approaches to Stop Hypertension. To eat this  way: Eat plenty of fresh fruits and vegetables. Try to fill one half of your plate at each meal with fruits and vegetables. Eat whole grains, such as whole-wheat pasta, brown rice, or whole-grain bread.  Fill about one fourth of your plate with whole grains. Eat or drink low-fat dairy products, such as skim milk or low-fat yogurt. Avoid fatty cuts of meat, processed or cured meats, and poultry with skin. Fill about one fourth of your plate with lean proteins, such as fish, chicken without skin, beans, eggs, or tofu. Avoid pre-made and processed foods. These tend to be higher in sodium, added sugar, and fat. Reduce your daily sodium intake. Many people with hypertension should eat less than 1,500 mg of sodium a day. Do not drink alcohol if: Your health care provider tells you not to drink. You are pregnant, may be pregnant, or are planning to become pregnant. If you drink alcohol: Limit how much you have to: 0-1 drink a day for women. 0-2 drinks a day for men. Know how much alcohol is in your drink. In the [place] one drink equals one 12 oz bottle of beer (355 mL), one 5 oz glass of wine (148 mL), or one 1 oz glass of hard liquor (44 mL). Lifestyle  Work with your health care provider to maintain a healthy body weight or to lose weight. Ask what an ideal weight is for you. Get at least 30 minutes of exercise that causes your heart to beat faster (aerobic exercise) most days of the week. Activities may include walking, swimming, or biking. Include exercise to strengthen your muscles (resistance exercise), such as Pilates or lifting weights, as part of your weekly exercise routine. Try to do these types of exercises for 30 minutes at least 3 days a week. Do not use any products that contain nicotine or tobacco. These products include cigarettes, chewing tobacco, and vaping devices, such as e-cigarettes. If you need help quitting, ask your health care provider. Monitor your blood pressure at home as told by your health care provider. Keep all follow-up visits. This is important. Medicines Take over-the-counter and prescription medicines only as told by your health care provider. Follow directions  carefully. Blood pressure medicines must be taken as prescribed. Do not skip doses of blood pressure medicine. Doing this puts you at risk for problems and can make the medicine less effective. Ask your health care provider about side effects or reactions to medicines that you should watch for. Contact a health care provider if you: Think you are having a reaction to a medicine you are taking. Have headaches that keep coming back (recurring). Feel dizzy. Have swelling in your ankles. Have trouble with your vision. Get help right away if you: Develop a severe headache or confusion. Have unusual weakness or numbness. Feel faint. Have severe pain in your chest or abdomen. Vomit repeatedly. Have trouble breathing. These symptoms may be an emergency. Get help right away. Call 911. Do not wait to see if the symptoms will go away. Do not drive yourself to the hospital. Summary Hypertension is when the force of blood pumping through your arteries is too strong. If this condition is not controlled, it may put you at risk for serious complications. Your personal target blood pressure may vary depending on your medical conditions, your age, and other factors. For most people, a normal blood pressure is less than 120/80. Hypertension is treated with lifestyle changes, medicines, or a combination of both. Lifestyle changes include losing weight, eating  a healthy, low-sodium diet, exercising more, and limiting alcohol. This information is not intended to replace advice given to you by your health care provider. Make sure you discuss any questions you have with your health care provider. Document Revised: 10/21/2021 Document Reviewed: 10/21/2021 Elsevier Patient Education  2024 Elsevier Inc.    Edwina Barth, MD [facility]

## 2023-10-18 ENCOUNTER — Ambulatory Visit: Payer: Medicare Other | Attending: Cardiovascular Disease | Admitting: Cardiovascular Disease

## 2023-10-18 ENCOUNTER — Encounter: Payer: Self-pay | Admitting: Cardiovascular Disease

## 2023-10-18 VITALS — BP 155/85 | HR 79 | Ht 70.0 in | Wt 223.0 lb

## 2023-10-18 DIAGNOSIS — I10 Essential (primary) hypertension: Secondary | ICD-10-CM | POA: Diagnosis not present

## 2023-10-18 DIAGNOSIS — R600 Localized edema: Secondary | ICD-10-CM

## 2023-10-18 DIAGNOSIS — E785 Hyperlipidemia, unspecified: Secondary | ICD-10-CM

## 2023-10-18 MED ORDER — SPIRONOLACTONE 25 MG PO TABS: 25.0000 mg | ORAL_TABLET | Freq: Every day | ORAL | 3 refills | Status: DC

## 2023-10-18 NOTE — Progress Notes (Signed)
  Cardiology Office Note:  .   Date:  10/18/2023  ID:  Justin Santos, DOB 11-09-1956, MRN [id number] PCP: Georgina Quint, MD  [facility] Providers Cardiologist:  None    History of Present Illness: .   Justin Santos is a 67 y.o. male with a history of hyperlipidemia, HTN  and peripheral edema.  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         Risk Assessment/Calculations:     HYPERTENSION CONTROL Vitals:   10/18/23 1557 10/18/23 1620  BP: (!) 167/87 (!) 155/85    The patient's blood pressure is elevated above target today.  In order to address the patient's elevated BP: A new medication was prescribed today.        Physical Exam:   VS:  BP (!) 155/85   Pulse 79   Ht 5\' 10"  (1.778 m)   Wt 223 lb (101.2 kg)   SpO2 97%   BMI 32.00 kg/m    Wt Readings from Last 3 Encounters:  10/18/23 223 lb (101.2 kg)  07/27/23 221 lb 4 oz (100.4 kg)  11/17/22 215 lb (97.5 kg)    GEN: Well nourished, well developed in no acute distress NECK: No JVD; No carotid bruits CARDIAC: RRR, no murmurs, rubs, gallops RESPIRATORY:  Clear to auscultation without rales, wheezing or rhonchi  ABDOMEN: Soft, non-tender, non-distended EXTREMITIES:  No edema; No deformity   ASSESSMENT AND PLAN: .   1.  Hypertension: Probably seems to be doing better after he is tightened up on his diet and he has been using some Lasix.  Blood pressure remains mildly elevated.  Will add spironolactone 25 mg a day.  Will continue the current dose of valsartan and amlodipine. Will check a basic metabolic profile in 2 weeks.  He will follow-up with an APP in 3 months.  2.  Leg edema: I suspect his leg edema is due to his high-dose amlodipine.  We will get an echocardiogram to assess his LV function.  He has been  eating a better diet recently.  He has been trying to avoid excess salt.  Will add spironolactone which should help prevent his leg edema         Dispo: APP in 3 months    Signed, Kristeen Miss, MD

## 2023-10-18 NOTE — Patient Instructions (Addendum)
Medication Instructions:  Your physician has recommended you make the following change in your medication:  1-START Spironolactone 25 mg by mouth daily.  *If you need a refill on your cardiac medications before your next appointment, please call your pharmacy*  Lab Work: Your physician recommends that you return for lab work in: 2 weeks for BMET  If you have labs (blood work) drawn today and your tests are completely normal, you will receive your results only by: MyChart Message (if you have MyChart) OR A paper copy in the mail If you have any lab test that is abnormal or we need to change your treatment, we will call you to review the results.  Testing/Procedures: Your physician has requested that you have an echocardiogram. Echocardiography is a painless test that uses sound waves to create images of your heart. It provides your doctor with information about the size and shape of your heart and how well your heart's chambers and valves are working. This procedure takes approximately one hour. There are no restrictions for this procedure. Please do NOT wear cologne, perfume, aftershave, or lotions (deodorant is allowed). Please arrive 15 minutes prior to your appointment time.    Follow-Up: At [facility], you and your health needs are our priority.  As part of our continuing mission to provide you with exceptional heart care, we have created designated Provider Care Teams.  These Care Teams include your primary Cardiologist (physician) and Advanced Practice Providers (APPs -  Physician Assistants and Nurse Practitioners) who all work together to provide you with the care you need, when you need it.  We recommend signing up for the patient portal called "MyChart".  Sign up information is provided on this After Visit Summary.  MyChart is used to connect with patients for Virtual Visits (Telemedicine).  Patients are able to view lab/test results, encounter notes, upcoming  appointments, etc.  Non-urgent messages can be sent to your provider as well.   To learn more about what you can do with MyChart, go to [email or web address].    Your next appointment:   3 month(s)  Provider:   Jari Favre, PA-C, Ronie Spies, PA-C, Robin Searing, NP, Jacolyn Reedy, PA-C, Eligha Bridegroom, NP, Tereso Newcomer, PA-C, or Perlie Gold, PA-C

## 2023-10-26 ENCOUNTER — Ambulatory Visit: Payer: Medicare Other | Admitting: Cardiovascular Disease

## 2023-11-03 ENCOUNTER — Other Ambulatory Visit: Payer: Self-pay | Admitting: Cardiovascular Disease

## 2023-11-03 DIAGNOSIS — R600 Localized edema: Secondary | ICD-10-CM | POA: Diagnosis not present

## 2023-11-03 DIAGNOSIS — E785 Hyperlipidemia, unspecified: Secondary | ICD-10-CM | POA: Diagnosis not present

## 2023-11-04 LAB — BASIC METABOLIC PANEL
BUN/Creatinine Ratio: 16 (ref 10–24)
BUN: 20 mg/dL (ref 8–27)
CO2: 22 mmol/L (ref 20–29)
Calcium: 10.2 mg/dL (ref 8.6–10.2)
Chloride: 101 mmol/L (ref 96–106)
Creatinine, Ser: 1.25 mg/dL (ref 0.76–1.27)
Glucose: 101 mg/dL — ABNORMAL HIGH (ref 70–99)
Potassium: 4.7 mmol/L (ref 3.5–5.2)
Sodium: 137 mmol/L (ref 134–144)
eGFR: 63 mL/min/{1.73_m2} (ref >59)

## 2023-11-11 ENCOUNTER — Ambulatory Visit (HOSPITAL_COMMUNITY): Payer: Medicare Other | Attending: Cardiology

## 2023-11-11 DIAGNOSIS — E785 Hyperlipidemia, unspecified: Secondary | ICD-10-CM | POA: Diagnosis not present

## 2023-11-11 DIAGNOSIS — R600 Localized edema: Secondary | ICD-10-CM | POA: Insufficient documentation

## 2023-11-11 DIAGNOSIS — I10 Essential (primary) hypertension: Secondary | ICD-10-CM | POA: Diagnosis not present

## 2023-11-11 LAB — ECHOCARDIOGRAM COMPLETE
Area-P 1/2: 4.26 cm2
S' Lateral: 3 cm

## 2024-01-25 NOTE — Progress Notes (Unsigned)
Cardiology Office Note    Patient Name: Justin Santos Date of Encounter: 01/26/2024  Primary Care Provider:  Georgina Quint, MD Primary Cardiologist:  None Primary Electrophysiologist: None   Past Medical History    Past Medical History:  Diagnosis Date   Allergy    Arthritis    Environmental allergies    GERD (gastroesophageal reflux disease)    Hyperlipidemia    Hypertension    IBS (irritable bowel syndrome)     History of Present Illness  Justin Santos is a 68 y.o. male with a PMH of essential hypertension, hyperlipidemia, history of CVA 07/2019 GERD, IBS who presents today for 41-month follow-up.  Justin Santos was seen initially by Dr. Elease Hashimoto on 10/18/2023 by referral of PCP for complaint of lower extremity swelling and hypertension.  He has previous history of mild chronic small vessel disease with diagnosis of CVA in 02/2019 2D echo was completed 10/2023 that showed EF of 60 to 65% with no RWMA and no significant valve abnormalities.  He reported eating better and walking 2-1/2 to 3 miles a day.  His blood pressure was elevated however at 167/87 and 155/85.  He was continued on valsartan and amlodipine with spironolactone 25 mg added to current regimen. He was advised to continue his Lasix for lower extremity swelling. He was also instructed to avoid excess salt in his diet.  Justin Santos presents today for 3-month follow-up.  He was previously started on spironolactone 25mg , which has been well-tolerated and effective in managing the swelling. The patient reports no side effects from the medication. The patient's blood pressure readings at home range from 130 to 140, which is an improvement from previous office readings of 150 to 160. The patient is not currently on aspirin therapy, which was previously prescribed for stroke prevention. The patient has a physically demanding job and is active in managing their health, including monitoring their salt intake and physical activity. The  patient also uses various supplements and herbal remedies to manage their health. He denies chest pain, palpitations, dyspnea, PND, orthopnea, nausea, vomiting, dizziness, syncope, edema, weight gain, or early satiety.   Review of Systems  Please see the history of present illness.    All other systems reviewed and are otherwise negative except as noted above.  Physical Exam    Wt Readings from Last 3 Encounters:  01/26/24 218 lb 12.8 oz (99.2 kg)  10/18/23 223 lb (101.2 kg)  07/27/23 221 lb 4 oz (100.4 kg)   VS: Vitals:   01/26/24 1028  BP: 138/84  Pulse: 83  SpO2: 98%  ,Body mass index is 31.39 kg/m. GEN: Well nourished, well developed in no acute distress Neck: No JVD; No carotid bruits Pulmonary: Clear to auscultation without rales, wheezing or rhonchi  Cardiovascular: Normal rate. Regular rhythm. Normal S1. Normal S2.   Murmurs: There is no murmur.  ABDOMEN: Soft, non-tender, non-distended EXTREMITIES:  No edema; No deformity   EKG/LABS/ Recent Cardiac Studies   ECG personally reviewed by me today -none completed today  Risk Assessment/Calculations:       Lab Results  Component Value Date   WBC 6.1 07/27/2023   HGB 15.1 07/27/2023   HCT 45.6 07/27/2023   MCV 88.4 07/27/2023   PLT 343.0 07/27/2023   Lab Results  Component Value Date   CREATININE 1.25 11/03/2023   BUN 20 11/03/2023   NA 137 11/03/2023   K 4.7 11/03/2023   CL 101 11/03/2023   CO2 22 11/03/2023  Lab Results  Component Value Date   CHOL 206 (H) 07/27/2023   HDL 57.30 07/27/2023   LDLCALC 125 (H) 07/27/2023   LDLDIRECT 147.0 11/24/2021   TRIG 116.0 07/27/2023   CHOLHDL 4 07/27/2023    Lab Results  Component Value Date   HGBA1C 6.6 (H) 07/27/2023   Assessment & Plan    1.  Essential hypertension: -Blood pressure well controlled on current regimen, including Spironolactone 25mg  daily. Edema improved. Patient is active and monitoring salt intake. -Continue amlodipine 10 mg and Diovan  80 mg -Continue current regimen. -Check kidney function and potassium levels today due to Spironolactone use.  2.  Hyperlipidemia: -Patient's LDL cholesterol was elevated at 125 -Patient will have lipids rechecked by PCP who is currently managing. -Continue Crestor 40 mg daily  3.  Lower extremity edema: -Improved with spironolactone and dietary changes. -Continue spironolactone 25 mg daily  4.  History of CVA: -Not currently on aspirin. Discussed the benefits of aspirin for secondary prevention of cardiovascular disease. -Resume Aspirin 81mg  daily and continue Lipitor 40 mg daily  Disposition: Follow-up with None or APP in 6 months   Signed, Napoleon Form, Leodis Rains, NP 01/26/2024, 11:05 AM [facility]

## 2024-01-26 ENCOUNTER — Encounter: Payer: Self-pay | Admitting: Nurse Practitioner

## 2024-01-26 ENCOUNTER — Ambulatory Visit: Payer: Medicare Other | Attending: Nurse Practitioner | Admitting: Nurse Practitioner

## 2024-01-26 VITALS — BP 138/84 | HR 83 | Ht 70.0 in | Wt 218.8 lb

## 2024-01-26 DIAGNOSIS — Z8673 Personal history of transient ischemic attack (TIA), and cerebral infarction without residual deficits: Secondary | ICD-10-CM

## 2024-01-26 DIAGNOSIS — R600 Localized edema: Secondary | ICD-10-CM | POA: Diagnosis not present

## 2024-01-26 DIAGNOSIS — I10 Essential (primary) hypertension: Secondary | ICD-10-CM | POA: Diagnosis not present

## 2024-01-26 DIAGNOSIS — E785 Hyperlipidemia, unspecified: Secondary | ICD-10-CM | POA: Diagnosis not present

## 2024-01-26 MED ORDER — ASPIRIN 81 MG PO TBEC: 81.0000 mg | DELAYED_RELEASE_TABLET | Freq: Every day | ORAL | Status: DC

## 2024-01-26 NOTE — Patient Instructions (Signed)
Medication Instructions:  Aspirin 81 mg  *If you need a refill on your cardiac medications before your next appointment, please call your pharmacy*   Lab Work: Bmet  If you have labs (blood work) drawn today and your tests are completely normal, you will receive your results only by: MyChart Message (if you have MyChart) OR A paper copy in the mail If you have any lab test that is abnormal or we need to change your treatment, we will call you to review the results.   Testing/Procedures: None    Follow-Up: At [facility], you and your health needs are our priority.  As part of our continuing mission to provide you with exceptional heart care, we have created designated Provider Care Teams.  These Care Teams include your primary Cardiologist (physician) and Advanced Practice Providers (APPs -  Physician Assistants and Nurse Practitioners) who all work together to provide you with the care you need, when you need it.   Your next appointment:   6 month(s)  Provider:   Robin Searing NP   Other Instructions

## 2024-01-27 LAB — BASIC METABOLIC PANEL
BUN/Creatinine Ratio: 9 — ABNORMAL LOW (ref 10–24)
BUN: 14 mg/dL (ref 8–27)
CO2: 22 mmol/L (ref 20–29)
Calcium: 10 mg/dL (ref 8.6–10.2)
Chloride: 103 mmol/L (ref 96–106)
Creatinine, Ser: 1.48 mg/dL — ABNORMAL HIGH (ref 0.76–1.27)
Glucose: 86 mg/dL (ref 70–99)
Potassium: 4.4 mmol/L (ref 3.5–5.2)
Sodium: 141 mmol/L (ref 134–144)
eGFR: 51 mL/min/{1.73_m2} — ABNORMAL LOW (ref >59)

## 2024-02-01 ENCOUNTER — Other Ambulatory Visit: Payer: Self-pay

## 2024-02-01 MED ORDER — SPIRONOLACTONE 25 MG PO TABS: 12.5000 mg | ORAL_TABLET | Freq: Every day | ORAL | 3 refills | Status: DC

## 2024-03-27 DIAGNOSIS — J301 Allergic rhinitis due to pollen: Secondary | ICD-10-CM | POA: Diagnosis not present

## 2024-09-05 ENCOUNTER — Ambulatory Visit (INDEPENDENT_AMBULATORY_CARE_PROVIDER_SITE_OTHER): Admitting: Emergency Medicine

## 2024-09-05 ENCOUNTER — Encounter: Payer: Self-pay | Admitting: Emergency Medicine

## 2024-09-05 ENCOUNTER — Ambulatory Visit (INDEPENDENT_AMBULATORY_CARE_PROVIDER_SITE_OTHER)

## 2024-09-05 VITALS — BP 144/78 | HR 65 | Ht 65.5 in | Wt 212.8 lb

## 2024-09-05 VITALS — BP 138/88 | HR 65 | Temp 98.5°F | Ht 66.0 in | Wt 212.0 lb

## 2024-09-05 DIAGNOSIS — Z8673 Personal history of transient ischemic attack (TIA), and cerebral infarction without residual deficits: Secondary | ICD-10-CM

## 2024-09-05 DIAGNOSIS — Z125 Encounter for screening for malignant neoplasm of prostate: Secondary | ICD-10-CM

## 2024-09-05 DIAGNOSIS — Z1329 Encounter for screening for other suspected endocrine disorder: Secondary | ICD-10-CM | POA: Diagnosis not present

## 2024-09-05 DIAGNOSIS — Z1159 Encounter for screening for other viral diseases: Secondary | ICD-10-CM

## 2024-09-05 DIAGNOSIS — I10 Essential (primary) hypertension: Secondary | ICD-10-CM | POA: Diagnosis not present

## 2024-09-05 DIAGNOSIS — Z23 Encounter for immunization: Secondary | ICD-10-CM | POA: Diagnosis not present

## 2024-09-05 DIAGNOSIS — Z0000 Encounter for general adult medical examination without abnormal findings: Secondary | ICD-10-CM

## 2024-09-05 DIAGNOSIS — E663 Overweight: Secondary | ICD-10-CM

## 2024-09-05 DIAGNOSIS — Z130 Encounter for screening for diseases of the blood and blood-forming organs and certain disorders involving the immune mechanism: Secondary | ICD-10-CM

## 2024-09-05 DIAGNOSIS — M461 Sacroiliitis, not elsewhere classified: Secondary | ICD-10-CM

## 2024-09-05 DIAGNOSIS — E785 Hyperlipidemia, unspecified: Secondary | ICD-10-CM

## 2024-09-05 DIAGNOSIS — R29818 Other symptoms and signs involving the nervous system: Secondary | ICD-10-CM

## 2024-09-05 DIAGNOSIS — Z0001 Encounter for general adult medical examination with abnormal findings: Secondary | ICD-10-CM

## 2024-09-05 DIAGNOSIS — Z13228 Encounter for screening for other metabolic disorders: Secondary | ICD-10-CM | POA: Diagnosis not present

## 2024-09-05 LAB — LIPID PANEL
Cholesterol: 201 mg/dL — ABNORMAL HIGH (ref 0–200)
HDL: 58.5 mg/dL (ref >39.00)
LDL Cholesterol: 131 mg/dL — ABNORMAL HIGH (ref 0–99)
NonHDL: 142.75
Total CHOL/HDL Ratio: 3
Triglycerides: 59 mg/dL (ref 0.0–149.0)
VLDL: 11.8 mg/dL (ref 0.0–40.0)

## 2024-09-05 LAB — CBC WITH DIFFERENTIAL/PLATELET
Basophils Absolute: 0.1 K/uL (ref 0.0–0.1)
Basophils Relative: 1.1 % (ref 0.0–3.0)
Eosinophils Absolute: 0.2 K/uL (ref 0.0–0.7)
Eosinophils Relative: 3.9 % (ref 0.0–5.0)
HCT: 50.1 % (ref 39.0–52.0)
Hemoglobin: 16.6 g/dL (ref 13.0–17.0)
Lymphocytes Relative: 31.6 % (ref 12.0–46.0)
Lymphs Abs: 1.4 K/uL (ref 0.7–4.0)
MCHC: 33.1 g/dL (ref 30.0–36.0)
MCV: 89.3 fl (ref 78.0–100.0)
Monocytes Absolute: 0.4 K/uL (ref 0.1–1.0)
Monocytes Relative: 9.7 % (ref 3.0–12.0)
Neutro Abs: 2.4 K/uL (ref 1.4–7.7)
Neutrophils Relative %: 53.7 % (ref 43.0–77.0)
Platelets: 330 K/uL (ref 150.0–400.0)
RBC: 5.61 Mil/uL (ref 4.22–5.81)
RDW: 14 % (ref 11.5–15.5)
WBC: 4.6 K/uL (ref 4.0–10.5)

## 2024-09-05 LAB — COMPREHENSIVE METABOLIC PANEL WITH GFR
ALT: 12 U/L (ref 0–53)
AST: 16 U/L (ref 0–37)
Albumin: 4.3 g/dL (ref 3.5–5.2)
Alkaline Phosphatase: 56 U/L (ref 39–117)
BUN: 15 mg/dL (ref 6–23)
CO2: 30 meq/L (ref 19–32)
Calcium: 9.9 mg/dL (ref 8.4–10.5)
Chloride: 103 meq/L (ref 96–112)
Creatinine, Ser: 1.14 mg/dL (ref 0.40–1.50)
GFR: 65.81 mL/min (ref >60.00)
Glucose, Bld: 108 mg/dL — ABNORMAL HIGH (ref 70–99)
Potassium: 4.6 meq/L (ref 3.5–5.1)
Sodium: 138 meq/L (ref 135–145)
Total Bilirubin: 1.1 mg/dL (ref 0.2–1.2)
Total Protein: 7.5 g/dL (ref 6.0–8.3)

## 2024-09-05 LAB — PSA: PSA: 4.38 ng/mL — ABNORMAL HIGH (ref 0.10–4.00)

## 2024-09-05 LAB — HEMOGLOBIN A1C: Hgb A1c MFr Bld: 6.5 % (ref 4.6–6.5)

## 2024-09-05 MED ORDER — VALSARTAN-HYDROCHLOROTHIAZIDE 160-12.5 MG PO TABS: 1.0000 | ORAL_TABLET | Freq: Every day | ORAL | 3 refills | Status: AC

## 2024-09-05 NOTE — Assessment & Plan Note (Addendum)
 Wt Readings from Last 3 Encounters:  09/05/24 212 lb (96.2 kg)  09/05/24 212 lb 12.8 oz (96.5 kg)  01/26/24 218 lb 12.8 oz (99.2 kg)  Eating better and losing weight Diet and nutrition discussed

## 2024-09-05 NOTE — Patient Instructions (Addendum)
 Health Maintenance, Male Adopting a healthy lifestyle and getting preventive care are important in promoting health and wellness. Ask your health care provider about: The right schedule for you to have regular tests and exams. Things you can do on your own to prevent diseases and keep yourself healthy. What should I know about diet, weight, and exercise? Eat a healthy diet  Eat a diet that includes plenty of vegetables, fruits, low-fat dairy products, and lean protein. Do not eat a lot of foods that are high in solid fats, added sugars, or sodium. Maintain a healthy weight Body mass index (BMI) is a measurement that can be used to identify possible weight problems. It estimates body fat based on height and weight. Your health care provider can help determine your BMI and help you achieve or maintain a healthy weight. Get regular exercise Get regular exercise. This is one of the most important things you can do for your health. Most adults should: Exercise for at least 150 minutes each week. The exercise should increase your heart rate and make you sweat (moderate-intensity exercise). Do strengthening exercises at least twice a week. This is in addition to the moderate-intensity exercise. Spend less time sitting. Even light physical activity can be beneficial. Watch cholesterol and blood lipids Have your blood tested for lipids and cholesterol at 69 years of age, then have this test every 5 years. You may need to have your cholesterol levels checked more often if: Your lipid or cholesterol levels are high. You are older than 69 years of age. You are at high risk for heart disease. What should I know about cancer screening? Many types of cancers can be detected early and may often be prevented. Depending on your health history and family history, you may need to have cancer screening at various ages. This may include screening for: Colorectal cancer. Prostate cancer. Skin cancer. Lung  cancer. What should I know about heart disease, diabetes, and high blood pressure? Blood pressure and heart disease High blood pressure causes heart disease and increases the risk of stroke. This is more likely to develop in people who have high blood pressure readings or are overweight. Talk with your health care provider about your target blood pressure readings. Have your blood pressure checked: Every 3-5 years if you are 35-34 years of age. Every year if you are 76 years old or older. If you are between the ages of 26 and 67 and are a current or former smoker, ask your health care provider if you should have a one-time screening for abdominal aortic aneurysm (AAA). Diabetes Have regular diabetes screenings. This checks your fasting blood sugar level. Have the screening done: Once every three years after age 51 if you are at a normal weight and have a low risk for diabetes. More often and at a younger age if you are overweight or have a high risk for diabetes. What should I know about preventing infection? Hepatitis B If you have a higher risk for hepatitis B, you should be screened for this virus. Talk with your health care provider to find out if you are at risk for hepatitis B infection. Hepatitis C Blood testing is recommended for: Everyone born from [birth year] through 1966. Anyone with known risk factors for hepatitis C. Sexually transmitted infections (STIs) You should be screened each year for STIs, including gonorrhea and chlamydia, if: You are sexually active and are younger than 69 years of age. You are older than 69 years of age and your  health care provider tells you that you are at risk for this type of infection. Your sexual activity has changed since you were last screened, and you are at increased risk for chlamydia or gonorrhea. Ask your health care provider if you are at risk. Ask your health care provider about whether you are at high risk for HIV. Your health care provider  may recommend a prescription medicine to help prevent HIV infection. If you choose to take medicine to prevent HIV, you should first get tested for HIV. You should then be tested every 3 months for as long as you are taking the medicine. Follow these instructions at home: Alcohol use Do not drink alcohol if your health care provider tells you not to drink. If you drink alcohol: Limit how much you have to 0-2 drinks a day. Know how much alcohol is in your drink. In the [place] one drink equals one 12 oz bottle of beer (355 mL), one 5 oz glass of wine (148 mL), or one 1 oz glass of hard liquor (44 mL). Lifestyle Do not use any products that contain nicotine or tobacco. These products include cigarettes, chewing tobacco, and vaping devices, such as e-cigarettes. If you need help quitting, ask your health care provider. Do not use street drugs. Do not share needles. Ask your health care provider for help if you need support or information about quitting drugs. General instructions Schedule regular health, dental, and eye exams. Stay current with your vaccines. Tell your health care provider if: You often feel depressed. You have ever been abused or do not feel safe at home. Medications: Stop amlodipine  and Aldactone  Start valsartan  HCT 160-12.5 mg daily Summary Adopting a healthy lifestyle and getting preventive care are important in promoting health and wellness. Follow your health care provider's instructions about healthy diet, exercising, and getting tested or screened for diseases. Follow your health care provider's instructions on monitoring your cholesterol and blood pressure. This information is not intended to replace advice given to you by your health care provider. Make sure you discuss any questions you have with your health care provider. Document Revised: 05/05/2021 Document Reviewed: 05/05/2021 Elsevier Patient Education  2024 [employer].

## 2024-09-05 NOTE — Progress Notes (Signed)
 Justin Santos 68 y.o.   Chief Complaint  Patient presents with   Annual Exam    Patient here for phyiscal. Questions about bp meds    HISTORY OF PRESENT ILLNESS: This is a 68 y.o. male here for annual physical and follow-up on chronic medical conditions including hypertension Presently not taking Aldactone  or amlodipine  Takes valsartan  and atorvastatin  History of dyslipidemia Eating better and exercising regularly.  HPI   Prior to Admission medications   Medication Sig Start Date End Date Taking? Authorizing Provider  acetaminophen  (TYLENOL ) 500 MG tablet Take 500-1,000 mg by mouth every 6 (six) hours as needed for mild pain or headache.   Yes [provider]  atorvastatin  (LIPITOR) 40 MG tablet Take 1 tablet (40 mg total) by mouth daily. 09/02/22  Yes Cayden Rautio, Emil Schanz, MD  baclofen  (LIORESAL ) 10 MG tablet Take 0.5-1 tablets (5-10 mg total) by mouth at bedtime as needed for muscle spasms. 05/30/21  Yes Hilts, Ozell, MD  cetirizine (ZYRTEC) 10 MG tablet Take 10 mg by mouth daily as needed for allergies or rhinitis.   Yes [provider]  valsartan -hydrochlorothiazide  (DIOVAN -HCT) 160-12.5 MG tablet Take 1 tablet by mouth daily. 09/05/24  Yes Tarrie Mcmichen, Emil Schanz, MD    Allergies  Allergen Reactions   Peanuts [Peanut Oil] Shortness Of Breath, Itching and Rash    Mouth and throat itch and this causes wheezing   Azithromycin Hives    Patient Active Problem List   Diagnosis Date Noted   LVH (left ventricular hypertrophy) 07/27/2023   Neurogenic claudication 05/21/2021   Chronic bilateral low back pain with bilateral sciatica 05/21/2021   Sacroiliitis (HCC) 11/20/2020   Dyslipidemia 08/23/2019   History of CVA (cerebrovascular accident) 08/23/2019   Essential hypertension 03/06/2019   Overweight 08/01/2013    Past Medical History:  Diagnosis Date   Allergy    Arthritis    Environmental allergies    GERD (gastroesophageal reflux disease)     Hyperlipidemia    Hypertension    IBS (irritable bowel syndrome)     Past Surgical History:  Procedure Laterality Date   COLECTOMY  12/29/1999   COLONOSCOPY     UPPER GASTROINTESTINAL ENDOSCOPY      Social History   Socioeconomic History   Marital status: Married    Spouse name: Not on file   Number of children: 2   Years of education: Not on file   Highest education level: Not on file  Occupational History   Not on file  Tobacco Use   Smoking status: Former    Current packs/day: 0.00    Types: Cigarettes    Quit date: 04/26/1994    Years since quitting: 30.3   Smokeless tobacco: Never  Vaping Use   Vaping status: Never Used  Substance and Sexual Activity   Alcohol use: No   Drug use: No   Sexual activity: Yes    Partners: Female    Comment: with monogamous partner  Other Topics Concern   Not on file  Social History Narrative   Pt is from [place], which is where he currently lives with his wife. 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    Social Drivers of Corporate investment banker Strain: Low Risk  (09/05/2024)   Overall Financial Resource Strain (CARDIA)    Difficulty of Paying Living Expenses: Not hard at all  Food Insecurity: No Food Insecurity (09/05/2024)   Hunger Vital Sign    Worried About Running Out of Food in the Last Year:  Never true    Ran Out of Food in the Last Year: Never true  Transportation Needs: No Transportation Needs (09/05/2024)   PRAPARE - Administrator, Civil Service (Medical): No    Lack of Transportation (Non-Medical): No  Physical Activity: Sufficiently Active (09/05/2024)   Exercise Vital Sign    Days of Exercise per Week: 6 days    Minutes of Exercise per Session: 60 min  Stress: No Stress Concern Present (09/05/2024)   [employer] of Occupational Health - Occupational Stress Questionnaire    Feeling of Stress: Not at all  Social Connections: Socially Integrated (09/05/2024)   Social Connection and Isolation Panel    Frequency of  Communication with Friends and Family: More than three times a week    Frequency of Social Gatherings with Friends and Family: Once a week    Attends Religious Services: More than 4 times per year    Active Member of [employer] or Organizations: Yes    Attends Engineer, structural: More than 4 times per year    Marital Status: Married  Catering manager Violence: Not At Risk (09/05/2024)   Humiliation, Afraid, Rape, and Kick questionnaire    Fear of Current or Ex-Partner: No    Emotionally Abused: No    Physically Abused: No    Sexually Abused: No    Family History  Problem Relation Age of Onset   Heart disease Father    Cancer - Colon Neg Hx    Stomach cancer Neg Hx    Esophageal cancer Neg Hx    Rectal cancer Neg Hx      Review of Systems  Constitutional: Negative.  Negative for chills and fever.  HENT: Negative.  Negative for congestion and sore throat.   Respiratory: Negative.  Negative for cough and shortness of breath.   Cardiovascular: Negative.  Negative for chest pain and palpitations.  Gastrointestinal:  Negative for abdominal pain, diarrhea, nausea and vomiting.  Genitourinary: Negative.  Negative for dysuria and hematuria.  Skin: Negative.  Negative for rash.  Neurological: Negative.  Negative for dizziness and headaches.  All other systems reviewed and are negative.   Vitals:   09/05/24 0835  BP: 138/88  Pulse: 65  Temp: 98.5 F (36.9 C)  SpO2: 99%    Physical Exam Vitals reviewed.  Constitutional:      Appearance: Normal appearance.  HENT:     Head: Normocephalic.     Mouth/Throat:     Mouth: Mucous membranes are moist.     Pharynx: Oropharynx is clear.  Eyes:     Extraocular Movements: Extraocular movements intact.     Pupils: Pupils are equal, round, and reactive to light.  Cardiovascular:     Rate and Rhythm: Normal rate and regular rhythm.     Pulses: Normal pulses.     Heart sounds: Normal heart sounds.  Pulmonary:     Effort:  Pulmonary effort is normal.     Breath sounds: Normal breath sounds.  Abdominal:     Palpations: Abdomen is soft.     Tenderness: There is no abdominal tenderness.  Musculoskeletal:     Cervical back: No tenderness.  Lymphadenopathy:     Cervical: No cervical adenopathy.  Skin:    General: Skin is warm and dry.     Capillary Refill: Capillary refill takes less than 2 seconds.  Neurological:     General: No focal deficit present.     Mental Status: He is alert and oriented to  person, place, and time.  Psychiatric:        Mood and Affect: Mood normal.        Behavior: Behavior normal.      ASSESSMENT & PLAN: Problem List Items Addressed This Visit       Cardiovascular and Mediastinum   Essential hypertension   BP Readings from Last 3 Encounters:  09/05/24 138/88  09/05/24 (!) 144/78  01/26/24 138/84  Not taking Aldactone  or amlodipine  Only taking valsartan  80 mg daily Recommend to start valsartan  HCTZ 160-12.5 mg daily Cardiovascular risks associated with hypertension discussed Eating better and losing weight       Relevant Medications   valsartan -hydrochlorothiazide  (DIOVAN -HCT) 160-12.5 MG tablet   Other Relevant Orders   Comprehensive metabolic panel with GFR     Other   Overweight   Wt Readings from Last 3 Encounters:  09/05/24 212 lb (96.2 kg)  09/05/24 212 lb 12.8 oz (96.5 kg)  01/26/24 218 lb 12.8 oz (99.2 kg)  Eating better and losing weight Diet and nutrition discussed       Dyslipidemia   Relevant Orders   Comprehensive metabolic panel with GFR   Lipid panel   History of CVA (cerebrovascular accident)   Secondary stroke prevention measures discussed Importance of controlling hypertension discussed Continue atorvastatin  40 mg daily Not diabetic      Sacroiliitis (HCC)   Stable.  Responding well to medications.      Relevant Orders   CBC with Differential/Platelet   Neurogenic claudication   Stable.  Asymptomatic at present time       Other Visit Diagnoses       Encounter for general adult medical examination with abnormal findings    -  Primary   Relevant Orders   CBC with Differential/Platelet   Comprehensive metabolic panel with GFR   Hemoglobin A1c   Lipid panel   PSA     Screening for deficiency anemia       Relevant Orders   CBC with Differential/Platelet     Screening for endocrine, metabolic and immunity disorder       Relevant Orders   Hemoglobin A1c     Screening for prostate cancer       Relevant Orders   PSA      Modifiable risk factors discussed with patient. Anticipatory guidance according to age provided. The following topics were also discussed: Social Determinants of Health Smoking.  Non-smoker Diet and nutrition Benefits of exercise Cancer screening and review of colonoscopy report from 2023 Vaccinations review and recommendations Cardiovascular risk assessment and need for blood work Diagnosis of hypertension and cardiovascular risks associated with this condition Review of all medications and changes made Mental health including depression and anxiety Fall and accident prevention  Patient Instructions  Health Maintenance, Male Adopting a healthy lifestyle and getting preventive care are important in promoting health and wellness. Ask your health care provider about: The right schedule for you to have regular tests and exams. Things you can do on your own to prevent diseases and keep yourself healthy. What should I know about diet, weight, and exercise? Eat a healthy diet  Eat a diet that includes plenty of vegetables, fruits, low-fat dairy products, and lean protein. Do not eat a lot of foods that are high in solid fats, added sugars, or sodium. Maintain a healthy weight Body mass index (BMI) is a measurement that can be used to identify possible weight problems. It estimates body fat based on height and weight. Your health  care provider can help determine your BMI and help  you achieve or maintain a healthy weight. Get regular exercise Get regular exercise. This is one of the most important things you can do for your health. Most adults should: Exercise for at least 150 minutes each week. The exercise should increase your heart rate and make you sweat (moderate-intensity exercise). Do strengthening exercises at least twice a week. This is in addition to the moderate-intensity exercise. Spend less time sitting. Even light physical activity can be beneficial. Watch cholesterol and blood lipids Have your blood tested for lipids and cholesterol at 69 years of age, then have this test every 5 years. You may need to have your cholesterol levels checked more often if: Your lipid or cholesterol levels are high. You are older than 69 years of age. You are at high risk for heart disease. What should I know about cancer screening? Many types of cancers can be detected early and may often be prevented. Depending on your health history and family history, you may need to have cancer screening at various ages. This may include screening for: Colorectal cancer. Prostate cancer. Skin cancer. Lung cancer. What should I know about heart disease, diabetes, and high blood pressure? Blood pressure and heart disease High blood pressure causes heart disease and increases the risk of stroke. This is more likely to develop in people who have high blood pressure readings or are overweight. Talk with your health care provider about your target blood pressure readings. Have your blood pressure checked: Every 3-5 years if you are 19-66 years of age. Every year if you are 62 years old or older. If you are between the ages of 46 and 28 and are a current or former smoker, ask your health care provider if you should have a one-time screening for abdominal aortic aneurysm (AAA). Diabetes Have regular diabetes screenings. This checks your fasting blood sugar level. Have the screening  done: Once every three years after age 6 if you are at a normal weight and have a low risk for diabetes. More often and at a younger age if you are overweight or have a high risk for diabetes. What should I know about preventing infection? Hepatitis B If you have a higher risk for hepatitis B, you should be screened for this virus. Talk with your health care provider to find out if you are at risk for hepatitis B infection. Hepatitis C Blood testing is recommended for: Everyone born from [birth year] through 1966. Anyone with known risk factors for hepatitis C. Sexually transmitted infections (STIs) You should be screened each year for STIs, including gonorrhea and chlamydia, if: You are sexually active and are younger than 69 years of age. You are older than 69 years of age and your health care provider tells you that you are at risk for this type of infection. Your sexual activity has changed since you were last screened, and you are at increased risk for chlamydia or gonorrhea. Ask your health care provider if you are at risk. Ask your health care provider about whether you are at high risk for HIV. Your health care provider may recommend a prescription medicine to help prevent HIV infection. If you choose to take medicine to prevent HIV, you should first get tested for HIV. You should then be tested every 3 months for as long as you are taking the medicine. Follow these instructions at home: Alcohol use Do not drink alcohol if your health care provider tells you not  to drink. If you drink alcohol: Limit how much you have to 0-2 drinks a day. Know how much alcohol is in your drink. In the [place] one drink equals one 12 oz bottle of beer (355 mL), one 5 oz glass of wine (148 mL), or one 1 oz glass of hard liquor (44 mL). Lifestyle Do not use any products that contain nicotine or tobacco. These products include cigarettes, chewing tobacco, and vaping devices, such as e-cigarettes. If you need help  quitting, ask your health care provider. Do not use street drugs. Do not share needles. Ask your health care provider for help if you need support or information about quitting drugs. General instructions Schedule regular health, dental, and eye exams. Stay current with your vaccines. Tell your health care provider if: You often feel depressed. You have ever been abused or do not feel safe at home. Summary Adopting a healthy lifestyle and getting preventive care are important in promoting health and wellness. Follow your health care provider's instructions about healthy diet, exercising, and getting tested or screened for diseases. Follow your health care provider's instructions on monitoring your cholesterol and blood pressure. This information is not intended to replace advice given to you by your health care provider. Make sure you discuss any questions you have with your health care provider. Document Revised: 05/05/2021 Document Reviewed: 05/05/2021 Elsevier Patient Education  2024 [employer]       Emil Schaumann, MD [facility]

## 2024-09-05 NOTE — Progress Notes (Signed)
 Subjective:   Justin Santos is a 68 y.o. who presents for a Medicare Wellness preventive visit.  As a reminder, Annual Wellness Visits don't include a physical exam, and some assessments may be limited, especially if this visit is performed virtually. We may recommend an in-person follow-up visit with your provider if needed.  Visit Complete: In person  Persons Participating in Visit: Patient.  AWV Questionnaire: No: Patient Medicare AWV questionnaire was not completed prior to this visit.  Cardiac Risk Factors include: advanced age (>68men, >71 women);dyslipidemia;hypertension;male gender     Objective:    Today's Vitals   09/05/24 0811  BP: (!) 144/78  Pulse: 65  SpO2: 99%  Weight: 212 lb 12.8 oz (96.5 kg)  Height: 5' 5.5 (1.664 m)   Body mass index is 34.87 kg/m.     09/05/2024    8:11 AM 03/06/2019    4:59 PM 04/26/2012    9:31 AM  Advanced Directives  Does Patient Have a Medical Advance Directive? No No  Patient does not have advance directive   Would patient like information on creating a medical advance directive? No - Patient declined       Data saved with a previous flowsheet row definition    Current Medications (verified) Outpatient Encounter Medications as of 09/05/2024  Medication Sig   acetaminophen  (TYLENOL ) 500 MG tablet Take 500-1,000 mg by mouth every 6 (six) hours as needed for mild pain or headache.   atorvastatin  (LIPITOR) 40 MG tablet Take 1 tablet (40 mg total) by mouth daily.   baclofen  (LIORESAL ) 10 MG tablet Take 0.5-1 tablets (5-10 mg total) by mouth at bedtime as needed for muscle spasms.   cetirizine (ZYRTEC) 10 MG tablet Take 10 mg by mouth daily as needed for allergies or rhinitis.   spironolactone  (ALDACTONE ) 25 MG tablet Take 0.5 tablets (12.5 mg total) by mouth daily.   valsartan  (DIOVAN ) 80 MG tablet Take 1 tablet (80 mg total) by mouth daily.   amLODipine  (NORVASC ) 10 MG tablet Take 1 tablet (10 mg total) by mouth daily. (Patient not  taking: Reported on 09/05/2024)   aspirin  EC 81 MG tablet Take 1 tablet (81 mg total) by mouth daily. Swallow whole. (Patient not taking: Reported on 09/05/2024)   furosemide  (LASIX ) 20 MG tablet Take 1 tablet (20 mg total) by mouth daily for 7 days. (Patient not taking: Reported on 09/05/2024)   Facility-Administered Encounter Medications as of 09/05/2024  Medication   0.9 %  sodium chloride  infusion    Allergies (verified) Peanuts [peanut oil] and Azithromycin   History: Past Medical History:  Diagnosis Date   Allergy    Arthritis    Environmental allergies    GERD (gastroesophageal reflux disease)    Hyperlipidemia    Hypertension    IBS (irritable bowel syndrome)    Past Surgical History:  Procedure Laterality Date   COLECTOMY  12/29/1999   COLONOSCOPY     UPPER GASTROINTESTINAL ENDOSCOPY     Family History  Problem Relation Age of Onset   Heart disease Father    Cancer - Colon Neg Hx    Stomach cancer Neg Hx    Esophageal cancer Neg Hx    Rectal cancer Neg Hx    Social History   Socioeconomic History   Marital status: Married    Spouse name: Not on file   Number of children: 2   Years of education: Not on file   Highest education level: Not on file  Occupational History  Not on file  Tobacco Use   Smoking status: Former    Current packs/day: 0.00    Types: Cigarettes    Quit date: 04/26/1994    Years since quitting: 30.3   Smokeless tobacco: Never  Vaping Use   Vaping status: Never Used  Substance and Sexual Activity   Alcohol use: No   Drug use: No   Sexual activity: Yes    Partners: Female    Comment: with monogamous partner  Other Topics Concern   Not on file  Social History Narrative   Pt is from [place], which is where he currently lives with his wife. 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    Social Drivers of Corporate investment banker Strain: Low Risk  (09/05/2024)   Overall Financial Resource Strain (CARDIA)    Difficulty of Paying Living Expenses: Not  hard at all  Food Insecurity: No Food Insecurity (09/05/2024)   Hunger Vital Sign    Worried About Running Out of Food in the Last Year: Never true    Ran Out of Food in the Last Year: Never true  Transportation Needs: No Transportation Needs (09/05/2024)   PRAPARE - Administrator, Civil Service (Medical): No    Lack of Transportation (Non-Medical): No  Physical Activity: Sufficiently Active (09/05/2024)   Exercise Vital Sign    Days of Exercise per Week: 6 days    Minutes of Exercise per Session: 60 min  Stress: No Stress Concern Present (09/05/2024)   [employer] of Occupational Health - Occupational Stress Questionnaire    Feeling of Stress: Not at all  Social Connections: Socially Integrated (09/05/2024)   Social Connection and Isolation Panel    Frequency of Communication with Friends and Family: More than three times a week    Frequency of Social Gatherings with Friends and Family: Once a week    Attends Religious Services: More than 4 times per year    Active Member of [employer] or Organizations: Yes    Attends Engineer, structural: More than 4 times per year    Marital Status: Married    Tobacco Counseling Counseling given: Not Answered    Clinical Intake:  Pre-visit preparation completed: Yes  Pain : No/denies pain     BMI - recorded: 34.87 Nutritional Status: BMI > 30  Obese Nutritional Risks: None Diabetes: No  Lab Results  Component Value Date   HGBA1C 6.6 (H) 07/27/2023   HGBA1C 6.4 09/02/2022   HGBA1C 6.1 11/24/2021     How often do you need to have someone help you when you read instructions, pamphlets, or other written materials from your doctor or pharmacy?: 1 - Never  Interpreter Needed?: No  Information entered by :: Verdie Saba, CMA   Activities of Daily Living     09/05/2024    8:14 AM  In your present state of health, do you have any difficulty performing the following activities:  Hearing? 0  Vision? 0  Difficulty  concentrating or making decisions? 0  Walking or climbing stairs? 0  Dressing or bathing? 0  Doing errands, shopping? 0  Preparing Food and eating ? N  Using the Toilet? N  In the past six months, have you accidently leaked urine? Y  Comment wears a pad  Do you have problems with loss of bowel control? N  Managing your Medications? N  Managing your Finances? N  Housekeeping or managing your Housekeeping? N    Patient Care Team: Sagardia, Miguel Jose,  MD as PCP - General (Internal Medicine)  I have updated your Care Teams any recent Medical Services you may have received from other providers in the past year.     Assessment:   This is a routine wellness examination for Elmin.  Hearing/Vision screen Hearing Screening - Comments:: Denies hearing difficulties   Vision Screening - Comments:: up to date with routine eye exams with [facility] (plans to get rx eyeglasses)   Goals Addressed               This Visit's Progress     Patient Stated (pt-stated)        Patient stated he plans to continue to stay active and watch diet       Depression Screen     09/05/2024    8:16 AM 07/27/2023    3:15 PM 09/02/2022    8:52 AM 02/25/2022    9:08 AM 11/24/2021    9:30 AM 11/24/2021    9:27 AM 11/20/2020    3:43 PM  PHQ 2/9 Scores  PHQ - 2 Score 0 0 0 0 0 0 0  PHQ- 9 Score 0          Fall Risk     09/05/2024    8:15 AM 07/27/2023    3:15 PM 09/02/2022    8:52 AM 02/25/2022    9:08 AM 11/24/2021    9:30 AM  Fall Risk   Falls in the past year? 0 0 0 0 0  Number falls in past yr: 0 0 0 0 0  Injury with Fall? 0 0 0 0 0  Risk for fall due to : No Fall Risks No Fall Risks No Fall Risks    Follow up Falls evaluation completed;Falls prevention discussed Falls evaluation completed Falls evaluation completed        Data saved with a previous flowsheet row definition    MEDICARE RISK AT HOME:  Medicare Risk at Home Any stairs in or around the home?: Yes (outside) If so, are  there any without handrails?: No Home free of loose throw rugs in walkways, pet beds, electrical cords, etc?: Yes Adequate lighting in your home to reduce risk of falls?: Yes Life alert?: No Use of a cane, walker or w/c?: No Grab bars in the bathroom?: No Shower chair or bench in shower?: No Elevated toilet seat or a handicapped toilet?: Yes  TIMED UP AND GO:  Was the test performed?  No  Cognitive Function: 6CIT completed        09/05/2024    8:20 AM  6CIT Screen  What Year? 0 points  What month? 0 points  What time? 0 points  Count back from 20 0 points  Months in reverse 0 points  Repeat phrase 2 points  Total Score 2 points    Immunizations Immunization History  Administered Date(s) Administered   Fluad Quad(high Dose 65+) 11/20/2020, 11/24/2021   INFLUENZA, HIGH DOSE SEASONAL PF 09/05/2024   Influenza,inj,Quad PF,6+ Mos 10/16/2019   PFIZER(Purple Top)SARS-COV-2 Vaccination 03/02/2020, 03/23/2020   PNEUMOCOCCAL CONJUGATE-20 11/24/2021   Td 05/31/2018    Screening Tests Health Maintenance  Topic Date Due   Hepatitis C Screening  Never done   Zoster Vaccines- Shingrix (1 of 2) Never done   COVID-19 Vaccine (3 - 2025-26 season) 08/28/2024   Medicare Annual Wellness (AWV)  09/05/2025   Colonoscopy  11/17/2025   DTaP/Tdap/Td (2 - Tdap) 05/31/2028   Pneumococcal Vaccine: 50+ Years  Completed   Influenza  Vaccine  Completed   HPV VACCINES  Aged Out   Meningococcal B Vaccine  Aged Out    Health Maintenance Items Addressed:  Influenza vaccine given today  Additional Screening:  Vision Screening: Recommended annual ophthalmology exams for early detection of glaucoma and other disorders of the eye. Is the patient up to date with their annual eye exam?  Yes  Who is the provider or what is the name of the office in which the patient attends annual eye exams? [facility]  Dental Screening: Recommended annual dental exams for proper oral hygiene  Community  Resource Referral / Chronic Care Management: CRR required this visit?  No   CCM required this visit?  No   Plan:    I have personally reviewed and noted the following in the patient's chart:   Medical and social history Use of alcohol, tobacco or illicit drugs  Current medications and supplements including opioid prescriptions. Patient is not currently taking opioid prescriptions. Functional ability and status Nutritional status Physical activity Advanced directives List of other physicians Hospitalizations, surgeries, and ER visits in previous 12 months Vitals Screenings to include cognitive, depression, and falls Referrals and appointments  In addition, I have reviewed and discussed with patient certain preventive protocols, quality metrics, and best practice recommendations. A written personalized care plan for preventive services as well as general preventive health recommendations were provided to patient.   Verdie CHRISTELLA Saba, CMA   09/05/2024   After Visit Summary: (In Person-Printed) AVS printed and given to the patient  Notes: Nothing significant to report at this time.

## 2024-09-05 NOTE — Assessment & Plan Note (Signed)
Stable.  Asymptomatic at present time.

## 2024-09-05 NOTE — Patient Instructions (Signed)
 Justin Santos,  Thank you for taking the time for your Medicare Wellness Visit. I appreciate your continued commitment to your health goals. Please review the care plan we discussed, and feel free to reach out if I can assist you further.  Medicare recommends these wellness visits once per year to help you and your care team stay ahead of potential health issues. These visits are designed to focus on prevention, allowing your provider to concentrate on managing your acute and chronic conditions during your regular appointments.  Please note that Annual Wellness Visits do not include a physical exam. Some assessments may be limited, especially if the visit was conducted virtually. If needed, we may recommend a separate in-person follow-up with your provider.  Ongoing Care Seeing your primary care provider every 3 to 6 months helps us  monitor your health and provide consistent, personalized care.   Referrals If a referral was made during today's visit and you haven't received any updates within two weeks, please contact the referred provider directly to check on the status.  Recommended Screenings:  Health Maintenance  Topic Date Due   Hepatitis C Screening  Never done   Zoster (Shingles) Vaccine (1 of 2) Never done   COVID-19 Vaccine (3 - 2025-26 season) 08/28/2024   Medicare Annual Wellness Visit  09/05/2025   Colon Cancer Screening  11/17/2025   DTaP/Tdap/Td vaccine (2 - Tdap) 05/31/2028   Pneumococcal Vaccine for age over 74  Completed   Flu Shot  Completed   HPV Vaccine  Aged Out   Meningitis B Vaccine  Aged Out       09/05/2024    8:11 AM  Advanced Directives  Does Patient Have a Medical Advance Directive? No  Would patient like information on creating a medical advance directive? No - Patient declined   Advance Care Planning is important because it: Ensures you receive medical care that aligns with your values, goals, and preferences. Provides guidance to your family and loved  ones, reducing the emotional burden of decision-making during critical moments.  Vision: Annual vision screenings are recommended for early detection of glaucoma, cataracts, and diabetic retinopathy. These exams can also reveal signs of chronic conditions such as diabetes and high blood pressure.  Dental: Annual dental screenings help detect early signs of oral cancer, gum disease, and other conditions linked to overall health, including heart disease and diabetes.

## 2024-09-05 NOTE — Assessment & Plan Note (Signed)
 Secondary stroke prevention measures discussed Importance of controlling hypertension discussed Continue atorvastatin  40 mg daily Not diabetic

## 2024-09-05 NOTE — Assessment & Plan Note (Signed)
 BP Readings from Last 3 Encounters:  09/05/24 138/88  09/05/24 (!) 144/78  01/26/24 138/84  Not taking Aldactone  or amlodipine  Only taking valsartan  80 mg daily Recommend to start valsartan  HCTZ 160-12.5 mg daily Cardiovascular risks associated with hypertension discussed Eating better and losing weight

## 2024-09-05 NOTE — Assessment & Plan Note (Signed)
Stable.  Responding well to medications.

## 2024-09-06 ENCOUNTER — Ambulatory Visit: Payer: Self-pay | Admitting: Emergency Medicine

## 2024-09-06 DIAGNOSIS — E1159 Type 2 diabetes mellitus with other circulatory complications: Secondary | ICD-10-CM

## 2024-09-06 DIAGNOSIS — E1169 Type 2 diabetes mellitus with other specified complication: Secondary | ICD-10-CM | POA: Insufficient documentation

## 2024-09-06 DIAGNOSIS — R972 Elevated prostate specific antigen [PSA]: Secondary | ICD-10-CM | POA: Insufficient documentation

## 2024-09-06 LAB — HEPATITIS C ANTIBODY: Hepatitis C Ab: NONREACTIVE

## 2024-09-06 MED ORDER — EMPAGLIFLOZIN 10 MG PO TABS
10.0000 mg | ORAL_TABLET | Freq: Every day | ORAL | 3 refills | Status: AC
Start: 2024-09-06 — End: ?

## 2024-09-15 ENCOUNTER — Telehealth: Payer: Self-pay | Admitting: Radiology

## 2024-09-15 NOTE — Telephone Encounter (Signed)
 Copied from CRM #[id number]. Topic: Clinical - Lab/Test Results >> Sep 14, 2024  5:50 PM Dedra B wrote: Reason for CRM: Pt called regarding lab results. Relayed results verbatim but pt would still like a call to discuss results and new meds.

## 2024-09-22 ENCOUNTER — Other Ambulatory Visit: Payer: Self-pay | Admitting: Emergency Medicine

## 2024-09-22 MED ORDER — CYCLOBENZAPRINE HCL 10 MG PO TABS: 10.0000 mg | ORAL_TABLET | Freq: Every day | ORAL | 0 refills | Status: AC

## 2024-09-22 NOTE — Telephone Encounter (Signed)
 If lower back pain is a chronic issue, recommend  orthopedic evaluation.  Place referral as needed. MyChart message regarding blood results is self-explanatory. May benefit from Flexeril  at bedtime.  Will send new prescription today. Thanks.

## 2024-09-26 NOTE — Telephone Encounter (Signed)
 Left detailed message for patient informing his of providers message  If lower back pain is a chronic issue, recommend  orthopedic evaluation.  Place referral as needed. MyChart message regarding blood results is self-explanatory. May benefit from Flexeril  at bedtime.  Will send new prescription today. Thanks.      He was advised to call back if he had further questions

## 2025-03-05 ENCOUNTER — Ambulatory Visit: Admitting: Emergency Medicine

## 2025-09-10 ENCOUNTER — Encounter: Admitting: Emergency Medicine

## 2025-09-10 ENCOUNTER — Ambulatory Visit
